# Patient Record
Sex: Male | Born: 1965 | Race: White | Hispanic: No | Marital: Married | Smoking: Current every day smoker
Health system: Southern US, Community
[De-identification: ages and names within clinical notes are randomized; demographics above are authoritative.]

## PROBLEM LIST (undated history)

## (undated) DIAGNOSIS — G8929 Other chronic pain: Secondary | ICD-10-CM

## (undated) DIAGNOSIS — M5430 Sciatica, unspecified side: Secondary | ICD-10-CM

## (undated) DIAGNOSIS — J449 Chronic obstructive pulmonary disease, unspecified: Secondary | ICD-10-CM

## (undated) DIAGNOSIS — J189 Pneumonia, unspecified organism: Secondary | ICD-10-CM

## (undated) DIAGNOSIS — M5030 Other cervical disc degeneration, unspecified cervical region: Secondary | ICD-10-CM

## (undated) DIAGNOSIS — M549 Dorsalgia, unspecified: Secondary | ICD-10-CM

## (undated) HISTORY — PX: COLONOSCOPY: SHX174

---

## 2001-06-26 ENCOUNTER — Emergency Department (HOSPITAL_COMMUNITY): Admission: EM | Admit: 2001-06-26 | Discharge: 2001-06-26 | Payer: Self-pay | Admitting: Emergency Medicine

## 2001-06-26 ENCOUNTER — Encounter: Payer: Self-pay | Admitting: Emergency Medicine

## 2002-09-29 ENCOUNTER — Emergency Department (HOSPITAL_COMMUNITY): Admission: EM | Admit: 2002-09-29 | Discharge: 2002-09-29 | Payer: Self-pay | Admitting: *Deleted

## 2002-09-29 ENCOUNTER — Encounter: Payer: Self-pay | Admitting: *Deleted

## 2002-11-20 ENCOUNTER — Emergency Department (HOSPITAL_COMMUNITY): Admission: EM | Admit: 2002-11-20 | Discharge: 2002-11-20 | Payer: Self-pay | Admitting: *Deleted

## 2002-11-20 ENCOUNTER — Encounter: Payer: Self-pay | Admitting: *Deleted

## 2002-12-14 ENCOUNTER — Emergency Department (HOSPITAL_COMMUNITY): Admission: EM | Admit: 2002-12-14 | Discharge: 2002-12-14 | Payer: Self-pay | Admitting: Emergency Medicine

## 2006-09-02 ENCOUNTER — Emergency Department (HOSPITAL_COMMUNITY): Admission: EM | Admit: 2006-09-02 | Discharge: 2006-09-02 | Payer: Self-pay | Admitting: Emergency Medicine

## 2008-03-19 ENCOUNTER — Emergency Department (HOSPITAL_COMMUNITY): Admission: EM | Admit: 2008-03-19 | Discharge: 2008-03-19 | Payer: Self-pay | Admitting: Emergency Medicine

## 2008-03-25 ENCOUNTER — Emergency Department (HOSPITAL_COMMUNITY): Admission: EM | Admit: 2008-03-25 | Discharge: 2008-03-25 | Payer: Self-pay | Admitting: Emergency Medicine

## 2008-04-24 ENCOUNTER — Emergency Department (HOSPITAL_COMMUNITY): Admission: EM | Admit: 2008-04-24 | Discharge: 2008-04-24 | Payer: Self-pay | Admitting: Emergency Medicine

## 2008-05-14 ENCOUNTER — Emergency Department (HOSPITAL_COMMUNITY): Admission: EM | Admit: 2008-05-14 | Discharge: 2008-05-14 | Payer: Self-pay | Admitting: Emergency Medicine

## 2008-09-23 ENCOUNTER — Emergency Department (HOSPITAL_COMMUNITY): Admission: EM | Admit: 2008-09-23 | Discharge: 2008-09-23 | Payer: Self-pay | Admitting: Emergency Medicine

## 2008-10-14 ENCOUNTER — Emergency Department (HOSPITAL_COMMUNITY): Admission: EM | Admit: 2008-10-14 | Discharge: 2008-10-14 | Payer: Self-pay | Admitting: Emergency Medicine

## 2009-04-18 ENCOUNTER — Emergency Department (HOSPITAL_COMMUNITY): Admission: EM | Admit: 2009-04-18 | Discharge: 2009-04-18 | Payer: Self-pay | Admitting: Emergency Medicine

## 2009-04-19 ENCOUNTER — Encounter (INDEPENDENT_AMBULATORY_CARE_PROVIDER_SITE_OTHER): Payer: Self-pay | Admitting: *Deleted

## 2009-05-04 ENCOUNTER — Telehealth (INDEPENDENT_AMBULATORY_CARE_PROVIDER_SITE_OTHER): Payer: Self-pay | Admitting: *Deleted

## 2009-07-07 ENCOUNTER — Emergency Department (HOSPITAL_COMMUNITY): Admission: EM | Admit: 2009-07-07 | Discharge: 2009-07-07 | Payer: Self-pay | Admitting: Emergency Medicine

## 2009-07-28 ENCOUNTER — Emergency Department (HOSPITAL_COMMUNITY): Admission: EM | Admit: 2009-07-28 | Discharge: 2009-07-28 | Payer: Self-pay | Admitting: Emergency Medicine

## 2010-03-13 ENCOUNTER — Emergency Department (HOSPITAL_COMMUNITY): Admission: EM | Admit: 2010-03-13 | Discharge: 2010-03-13 | Payer: Self-pay | Admitting: Emergency Medicine

## 2010-03-20 ENCOUNTER — Ambulatory Visit (HOSPITAL_COMMUNITY): Admission: RE | Admit: 2010-03-20 | Discharge: 2010-03-20 | Payer: Self-pay | Admitting: Orthopaedic Surgery

## 2010-04-05 ENCOUNTER — Encounter (HOSPITAL_COMMUNITY): Admission: RE | Admit: 2010-04-05 | Discharge: 2010-05-05 | Payer: Self-pay | Admitting: Orthopaedic Surgery

## 2010-05-08 ENCOUNTER — Emergency Department (HOSPITAL_COMMUNITY): Admission: EM | Admit: 2010-05-08 | Discharge: 2010-05-08 | Payer: Self-pay | Admitting: Emergency Medicine

## 2010-05-29 ENCOUNTER — Emergency Department (HOSPITAL_COMMUNITY): Admission: EM | Admit: 2010-05-29 | Discharge: 2010-05-30 | Payer: Self-pay | Admitting: Emergency Medicine

## 2010-06-09 ENCOUNTER — Emergency Department (HOSPITAL_COMMUNITY): Admission: EM | Admit: 2010-06-09 | Discharge: 2010-06-09 | Payer: Self-pay | Admitting: Emergency Medicine

## 2010-06-20 ENCOUNTER — Inpatient Hospital Stay (HOSPITAL_COMMUNITY): Admission: EM | Admit: 2010-06-20 | Discharge: 2010-06-23 | Payer: Self-pay | Admitting: Emergency Medicine

## 2010-06-29 ENCOUNTER — Emergency Department (HOSPITAL_COMMUNITY): Admission: EM | Admit: 2010-06-29 | Discharge: 2010-06-29 | Payer: Self-pay | Admitting: Emergency Medicine

## 2010-07-11 ENCOUNTER — Emergency Department (HOSPITAL_COMMUNITY)
Admission: EM | Admit: 2010-07-11 | Discharge: 2010-07-11 | Payer: Self-pay | Source: Home / Self Care | Admitting: Emergency Medicine

## 2010-07-25 ENCOUNTER — Emergency Department (HOSPITAL_COMMUNITY)
Admission: EM | Admit: 2010-07-25 | Discharge: 2010-07-25 | Payer: Self-pay | Source: Home / Self Care | Admitting: Emergency Medicine

## 2010-08-10 ENCOUNTER — Emergency Department (HOSPITAL_COMMUNITY): Admission: EM | Admit: 2010-08-10 | Discharge: 2010-08-10 | Payer: Self-pay | Admitting: Emergency Medicine

## 2010-09-07 ENCOUNTER — Emergency Department (HOSPITAL_COMMUNITY)
Admission: EM | Admit: 2010-09-07 | Discharge: 2010-09-07 | Payer: Self-pay | Source: Home / Self Care | Admitting: Emergency Medicine

## 2010-09-15 ENCOUNTER — Emergency Department (HOSPITAL_COMMUNITY): Admission: EM | Admit: 2010-09-15 | Discharge: 2010-09-15 | Payer: Self-pay | Admitting: Emergency Medicine

## 2010-09-27 ENCOUNTER — Emergency Department (HOSPITAL_COMMUNITY)
Admission: EM | Admit: 2010-09-27 | Discharge: 2010-09-27 | Payer: Self-pay | Source: Home / Self Care | Admitting: Emergency Medicine

## 2010-10-08 ENCOUNTER — Emergency Department (HOSPITAL_COMMUNITY)
Admission: EM | Admit: 2010-10-08 | Discharge: 2010-10-08 | Payer: Self-pay | Source: Home / Self Care | Admitting: Emergency Medicine

## 2010-10-23 ENCOUNTER — Emergency Department (HOSPITAL_COMMUNITY)
Admission: EM | Admit: 2010-10-23 | Discharge: 2010-10-23 | Payer: Self-pay | Source: Home / Self Care | Admitting: Emergency Medicine

## 2010-10-24 ENCOUNTER — Emergency Department (HOSPITAL_COMMUNITY)
Admission: EM | Admit: 2010-10-24 | Discharge: 2010-10-24 | Payer: Self-pay | Source: Home / Self Care | Admitting: Emergency Medicine

## 2010-11-10 ENCOUNTER — Emergency Department (HOSPITAL_COMMUNITY)
Admission: EM | Admit: 2010-11-10 | Discharge: 2010-11-10 | Payer: Self-pay | Source: Home / Self Care | Admitting: Emergency Medicine

## 2010-11-30 ENCOUNTER — Emergency Department (HOSPITAL_COMMUNITY)
Admission: EM | Admit: 2010-11-30 | Discharge: 2010-11-30 | Payer: Self-pay | Source: Home / Self Care | Admitting: Emergency Medicine

## 2011-01-01 ENCOUNTER — Emergency Department (HOSPITAL_COMMUNITY): Payer: PRIVATE HEALTH INSURANCE

## 2011-01-01 ENCOUNTER — Emergency Department (HOSPITAL_COMMUNITY)
Admission: EM | Admit: 2011-01-01 | Discharge: 2011-01-01 | Disposition: A | Discharge status: Home / Self Care | Payer: PRIVATE HEALTH INSURANCE | Attending: Emergency Medicine | Admitting: Emergency Medicine

## 2011-01-01 DIAGNOSIS — S1093XA Contusion of unspecified part of neck, initial encounter: Secondary | ICD-10-CM | POA: Insufficient documentation

## 2011-01-01 DIAGNOSIS — M542 Cervicalgia: Secondary | ICD-10-CM | POA: Insufficient documentation

## 2011-01-01 DIAGNOSIS — IMO0002 Reserved for concepts with insufficient information to code with codable children: Secondary | ICD-10-CM | POA: Insufficient documentation

## 2011-01-01 DIAGNOSIS — S0990XA Unspecified injury of head, initial encounter: Secondary | ICD-10-CM | POA: Insufficient documentation

## 2011-01-01 DIAGNOSIS — S0003XA Contusion of scalp, initial encounter: Secondary | ICD-10-CM | POA: Insufficient documentation

## 2011-01-01 DIAGNOSIS — W11XXXA Fall on and from ladder, initial encounter: Secondary | ICD-10-CM | POA: Insufficient documentation

## 2011-01-01 DIAGNOSIS — S20229A Contusion of unspecified back wall of thorax, initial encounter: Secondary | ICD-10-CM | POA: Insufficient documentation

## 2011-01-01 LAB — CBC
HCT: 45.4 % (ref 39.0–52.0)
Hemoglobin: 15.2 g/dL (ref 13.0–17.0)
MCHC: 33.5 g/dL (ref 30.0–36.0)
MCV: 86.3 fL (ref 78.0–100.0)
Platelets: 290 10*3/uL (ref 150–400)
RBC: 5.26 MIL/uL (ref 4.22–5.81)

## 2011-01-01 LAB — DIFFERENTIAL
Basophils Absolute: 0 10*3/uL (ref 0.0–0.1)
Eosinophils Absolute: 0.1 10*3/uL (ref 0.0–0.7)
Eosinophils Relative: 1 % (ref 0–5)
Lymphs Abs: 2.6 10*3/uL (ref 0.7–4.0)
Monocytes Relative: 10 % (ref 3–12)
Neutrophils Relative %: 51 % (ref 43–77)

## 2011-01-01 LAB — URINALYSIS, ROUTINE W REFLEX MICROSCOPIC
Bilirubin Urine: NEGATIVE
Hgb urine dipstick: NEGATIVE
Specific Gravity, Urine: <1.005 — ABNORMAL LOW (ref 1.005–1.030)
Urobilinogen, UA: 0.2 mg/dL (ref 0.0–1.0)

## 2011-01-16 ENCOUNTER — Emergency Department (HOSPITAL_COMMUNITY)
Admission: EM | Admit: 2011-01-16 | Discharge: 2011-01-16 | Disposition: A | Discharge status: Home / Self Care | Payer: Self-pay | Attending: Emergency Medicine | Admitting: Emergency Medicine

## 2011-01-16 DIAGNOSIS — M549 Dorsalgia, unspecified: Secondary | ICD-10-CM | POA: Insufficient documentation

## 2011-01-16 DIAGNOSIS — G8929 Other chronic pain: Secondary | ICD-10-CM | POA: Insufficient documentation

## 2011-01-25 LAB — CBC
MCHC: 33.5 g/dL (ref 30.0–36.0)
MCV: 87.2 fL (ref 78.0–100.0)
Platelets: 684 10*3/uL — ABNORMAL HIGH (ref 150–400)
RBC: 4.72 MIL/uL (ref 4.22–5.81)
RDW: 16.5 % — ABNORMAL HIGH (ref 11.5–15.5)

## 2011-01-25 LAB — DIFFERENTIAL
Basophils Relative: 0 % (ref 0–1)
Eosinophils Absolute: 0.1 10*3/uL (ref 0.0–0.7)
Lymphocytes Relative: 46 % (ref 12–46)
Monocytes Absolute: 1.2 10*3/uL — ABNORMAL HIGH (ref 0.1–1.0)
Neutro Abs: 5 10*3/uL (ref 1.7–7.7)
Neutrophils Relative %: 43 % (ref 43–77)

## 2011-01-25 LAB — BASIC METABOLIC PANEL
BUN: 15 mg/dL (ref 6–23)
CO2: 26 mEq/L (ref 19–32)
Calcium: 8.8 mg/dL (ref 8.4–10.5)
Creatinine, Ser: 1.33 mg/dL (ref 0.4–1.5)

## 2011-01-25 LAB — BLOOD GAS, ARTERIAL
Bicarbonate: 26.8 mEq/L — ABNORMAL HIGH (ref 20.0–24.0)
FIO2: 0.21 %
TCO2: 23.4 mmol/L (ref 0–100)

## 2011-01-26 LAB — URINE CULTURE: Culture: NO GROWTH

## 2011-01-26 LAB — DIFFERENTIAL
Basophils Absolute: 0 10*3/uL (ref 0.0–0.1)
Basophils Absolute: 0 10*3/uL (ref 0.0–0.1)
Basophils Relative: 0 % (ref 0–1)
Basophils Relative: 1 % (ref 0–1)
Eosinophils Relative: 1 % (ref 0–5)
Lymphocytes Relative: 24 % (ref 12–46)
Lymphs Abs: 0.9 10*3/uL (ref 0.7–4.0)
Lymphs Abs: 2 10*3/uL (ref 0.7–4.0)
Monocytes Absolute: 0.4 10*3/uL (ref 0.1–1.0)
Monocytes Relative: 6 % (ref 3–12)
Monocytes Relative: 9 % (ref 3–12)
Neutro Abs: 5.4 10*3/uL (ref 1.7–7.7)
Neutro Abs: 6.8 10*3/uL (ref 1.7–7.7)
Neutrophils Relative %: 66 % (ref 43–77)
Neutrophils Relative %: 84 % — ABNORMAL HIGH (ref 43–77)
Neutrophils Relative %: 84 % — ABNORMAL HIGH (ref 43–77)

## 2011-01-26 LAB — BASIC METABOLIC PANEL
BUN: 21 mg/dL (ref 6–23)
BUN: 32 mg/dL — ABNORMAL HIGH (ref 6–23)
CO2: 26 mEq/L (ref 19–32)
Calcium: 9 mg/dL (ref 8.4–10.5)
Calcium: 9.3 mg/dL (ref 8.4–10.5)
Calcium: 9.3 mg/dL (ref 8.4–10.5)
Chloride: 110 mEq/L (ref 96–112)
Creatinine, Ser: 1.06 mg/dL (ref 0.4–1.5)
Creatinine, Ser: 1.82 mg/dL — ABNORMAL HIGH (ref 0.4–1.5)
GFR calc Af Amer: >60 mL/min (ref >60)
GFR calc Af Amer: >60 mL/min (ref >60)
GFR calc non Af Amer: 58 mL/min — ABNORMAL LOW (ref >60)
GFR calc non Af Amer: >60 mL/min (ref >60)
GFR calc non Af Amer: >60 mL/min (ref >60)
Glucose, Bld: 133 mg/dL — ABNORMAL HIGH (ref 70–99)
Potassium: 3.6 mEq/L (ref 3.5–5.1)
Sodium: 135 mEq/L (ref 135–145)
Sodium: 140 mEq/L (ref 135–145)

## 2011-01-26 LAB — CBC
HCT: 37.5 % — ABNORMAL LOW (ref 39.0–52.0)
Hemoglobin: 12.1 g/dL — ABNORMAL LOW (ref 13.0–17.0)
MCH: 29.5 pg (ref 26.0–34.0)
MCHC: 33.6 g/dL (ref 30.0–36.0)
MCV: 86.1 fL (ref 78.0–100.0)
Platelets: 283 10*3/uL (ref 150–400)
RBC: 4.11 MIL/uL — ABNORMAL LOW (ref 4.22–5.81)
RDW: 16.1 % — ABNORMAL HIGH (ref 11.5–15.5)
RDW: 16.7 % — ABNORMAL HIGH (ref 11.5–15.5)
WBC: 10.5 10*3/uL (ref 4.0–10.5)
WBC: 8.1 10*3/uL (ref 4.0–10.5)

## 2011-01-26 LAB — CULTURE, BLOOD (ROUTINE X 2)
Culture: NO GROWTH
Report Status: 8142011
Report Status: 8142011

## 2011-01-26 LAB — BLOOD GAS, ARTERIAL
Bicarbonate: 21.8 mEq/L (ref 20.0–24.0)
O2 Saturation: 98.4 %
pH, Arterial: 7.4 (ref 7.350–7.450)
pO2, Arterial: 118 mmHg — ABNORMAL HIGH (ref 80.0–100.0)

## 2011-01-26 LAB — PROCALCITONIN: Procalcitonin: 42.28 ng/mL

## 2011-01-26 LAB — EXPECTORATED SPUTUM ASSESSMENT W GRAM STAIN, RFLX TO RESP C

## 2011-01-26 LAB — CULTURE, RESPIRATORY W GRAM STAIN: Culture: NORMAL

## 2011-01-26 LAB — ROCKY MTN SPOTTED FVR AB, IGG-BLOOD: RMSF IgG: 0.05 IV

## 2011-01-26 LAB — URINE MICROSCOPIC-ADD ON

## 2011-01-26 LAB — URINALYSIS, ROUTINE W REFLEX MICROSCOPIC
Glucose, UA: NEGATIVE mg/dL
Nitrite: NEGATIVE
Specific Gravity, Urine: 1.025 (ref 1.005–1.030)
pH: 5.5 (ref 5.0–8.0)

## 2011-01-26 LAB — RAPID URINE DRUG SCREEN, HOSP PERFORMED
Cocaine: POSITIVE — AB
Opiates: NOT DETECTED
Tetrahydrocannabinol: NOT DETECTED

## 2011-01-26 LAB — LACTIC ACID, PLASMA: Lactic Acid, Venous: 3 mmol/L — ABNORMAL HIGH (ref 0.5–2.2)

## 2011-01-26 LAB — B. BURGDORFI ANTIBODIES: B burgdorferi Ab IgG+IgM: 0.23 {ISR}

## 2011-02-19 LAB — CBC
HCT: 42.3 % (ref 39.0–52.0)
Hemoglobin: 14.9 g/dL (ref 13.0–17.0)
MCHC: 35.3 g/dL (ref 30.0–36.0)
RBC: 4.92 MIL/uL (ref 4.22–5.81)
RDW: 15 % (ref 11.5–15.5)

## 2011-02-19 LAB — DIFFERENTIAL
Basophils Absolute: 0 10*3/uL (ref 0.0–0.1)
Basophils Relative: 0 % (ref 0–1)
Monocytes Absolute: 0.4 10*3/uL (ref 0.1–1.0)
Neutro Abs: 5.8 10*3/uL (ref 1.7–7.7)
Neutrophils Relative %: 72 % (ref 43–77)

## 2011-02-19 LAB — BASIC METABOLIC PANEL
CO2: 26 mEq/L (ref 19–32)
Chloride: 106 mEq/L (ref 96–112)
GFR calc Af Amer: >60 mL/min (ref >60)
Glucose, Bld: 105 mg/dL — ABNORMAL HIGH (ref 70–99)
Potassium: 4 mEq/L (ref 3.5–5.1)
Sodium: 139 mEq/L (ref 135–145)

## 2011-03-06 ENCOUNTER — Emergency Department (HOSPITAL_COMMUNITY)
Admission: EM | Admit: 2011-03-06 | Discharge: 2011-03-06 | Disposition: A | Discharge status: Home / Self Care | Payer: Self-pay | Attending: Emergency Medicine | Admitting: Emergency Medicine

## 2011-03-06 DIAGNOSIS — G8929 Other chronic pain: Secondary | ICD-10-CM | POA: Insufficient documentation

## 2011-03-06 DIAGNOSIS — M545 Low back pain, unspecified: Secondary | ICD-10-CM | POA: Insufficient documentation

## 2011-03-06 DIAGNOSIS — Z79899 Other long term (current) drug therapy: Secondary | ICD-10-CM | POA: Insufficient documentation

## 2011-08-14 LAB — URINALYSIS, ROUTINE W REFLEX MICROSCOPIC
Bilirubin Urine: NEGATIVE
Nitrite: NEGATIVE
Specific Gravity, Urine: 1.025
Urobilinogen, UA: 0.2

## 2011-10-19 ENCOUNTER — Emergency Department (HOSPITAL_COMMUNITY): Payer: Self-pay

## 2011-10-19 ENCOUNTER — Emergency Department (HOSPITAL_COMMUNITY)
Admission: EM | Admit: 2011-10-19 | Discharge: 2011-10-19 | Disposition: A | Discharge status: Home / Self Care | Payer: Self-pay | Attending: Emergency Medicine | Admitting: Emergency Medicine

## 2011-10-19 ENCOUNTER — Encounter: Payer: Self-pay | Admitting: *Deleted

## 2011-10-19 DIAGNOSIS — Z8701 Personal history of pneumonia (recurrent): Secondary | ICD-10-CM | POA: Insufficient documentation

## 2011-10-19 DIAGNOSIS — J209 Acute bronchitis, unspecified: Secondary | ICD-10-CM | POA: Insufficient documentation

## 2011-10-19 DIAGNOSIS — F17200 Nicotine dependence, unspecified, uncomplicated: Secondary | ICD-10-CM | POA: Insufficient documentation

## 2011-10-19 DIAGNOSIS — J438 Other emphysema: Secondary | ICD-10-CM | POA: Insufficient documentation

## 2011-10-19 DIAGNOSIS — IMO0001 Reserved for inherently not codable concepts without codable children: Secondary | ICD-10-CM

## 2011-10-19 HISTORY — DX: Pneumonia, unspecified organism: J18.9

## 2011-10-19 HISTORY — DX: Other chronic pain: G89.29

## 2011-10-19 HISTORY — DX: Dorsalgia, unspecified: M54.9

## 2011-10-19 MED ORDER — OXYCODONE-ACETAMINOPHEN 5-325 MG PO TABS
1.0000 | ORAL_TABLET | Freq: Once | ORAL | Status: AC
  Administered 2011-10-19: 1 via ORAL
  Filled 2011-10-19: qty 1

## 2011-10-19 MED ORDER — OXYCODONE-ACETAMINOPHEN 5-325 MG PO TABS: 1.0000 | ORAL_TABLET | ORAL | Status: AC | PRN

## 2011-10-19 MED ORDER — PREDNISONE 20 MG PO TABS: ORAL_TABLET | ORAL | Status: DC

## 2011-10-19 MED ORDER — BENZONATATE 100 MG PO CAPS
200.0000 mg | ORAL_CAPSULE | Freq: Once | ORAL | Status: AC
  Administered 2011-10-19: 200 mg via ORAL
  Filled 2011-10-19: qty 2

## 2011-10-19 MED ORDER — BENZONATATE 200 MG PO CAPS: 200.0000 mg | ORAL_CAPSULE | Freq: Two times a day (BID) | ORAL | Status: AC | PRN

## 2011-10-19 MED ORDER — PREDNISONE 20 MG PO TABS
60.0000 mg | ORAL_TABLET | Freq: Once | ORAL | Status: AC
  Administered 2011-10-19: 60 mg via ORAL
  Filled 2011-10-19: qty 3

## 2011-10-19 NOTE — ED Notes (Signed)
Pt c/o right pain to rib area and sob; pt states he is very tired and just lying around; pt states he was in ICU x 1 year ago for pneumonia

## 2011-10-19 NOTE — ED Notes (Signed)
Pt called to be roomed. No answer.

## 2011-10-19 NOTE — ED Notes (Addendum)
Pt presents with SOB and right rib pain. Pt reports weakness and general not feeling well. Pt states approx 1 yr ago he was hospitalized for pneumonia. NAD at this time.

## 2011-10-19 NOTE — ED Notes (Signed)
Pt a/ox4. Resp even and unlabored. NAD at this time. D/C instructions reviewed with pt. Pt verbalized understanding. Pt ambulated to lobby with steady gate.  

## 2011-10-20 NOTE — ED Provider Notes (Signed)
History     CSN: [id number] Arrival date & time: 10/19/2011  2:55 PM   First MD Initiated Contact with Patient 10/19/11 1518      Chief Complaint  Patient presents with  . Shortness of Breath    (Consider location/radiation/quality/duration/timing/severity/associated sxs/prior treatment) Patient is a 45 y.o. male presenting with shortness of breath. The history is provided by the patient.  Shortness of Breath  The current episode started 3 to 5 days ago. The onset was gradual. The problem has been gradually worsening. The problem is moderate. The symptoms are relieved by nothing. The symptoms are aggravated by smoke exposure. Associated symptoms include chest pain, cough and shortness of breath. Pertinent negatives include no fever and no sore throat. The cough is non-productive and hacking. Nothing relieves the cough. The cough is worsened by smoke exposure. Incident Type: He is concerned about possible exposure to lead based paint,  stating last summer he sanded walls of a house without wearing a mask,  and learned later the house had lead based paint.  He denies hemoptysis. He has had no prior steroid use. He has had prior ICU admissions (He was treated last year for pneumonia per patient report.). There were no sick contacts. He has received no recent medical care.    Past Medical History  Diagnosis Date  . Pneumonia   . Chronic back pain     History reviewed. No pertinent past surgical history.  History reviewed. No pertinent family history.  History  Substance Use Topics  . Smoking status: Current Everyday Smoker  . Smokeless tobacco: Not on file  . Alcohol Use: No      Review of Systems  Constitutional: Negative for fever.  HENT: Negative for congestion, sore throat and neck pain.   Eyes: Negative.   Respiratory: Positive for cough and shortness of breath. Negative for chest tightness.   Cardiovascular: Positive for chest pain. Negative for palpitations and leg  swelling.  Gastrointestinal: Negative for nausea and abdominal pain.  Genitourinary: Negative.   Musculoskeletal: Negative for joint swelling and arthralgias.  Skin: Negative.  Negative for rash and wound.  Neurological: Negative for dizziness, weakness, light-headedness, numbness and headaches.  Hematological: Negative.   Psychiatric/Behavioral: Negative.     Allergies  Review of patient's allergies indicates no known allergies.  Home Medications   Current Outpatient Rx  Name Route Sig Dispense Refill  . ALBUTEROL SULFATE (2.5 MG/3ML) 0.083% IN NEBU Nebulization Take 2.5 mg by nebulization every 6 (six) hours as needed. For shortness of breath     . IBUPROFEN 200 MG PO TABS Oral Take 1,200 mg by mouth 6 (six) times daily. For pain     . BENZONATATE 200 MG PO CAPS Oral Take 1 capsule (200 mg total) by mouth 2 (two) times daily as needed for cough. 20 capsule 0  . OXYCODONE-ACETAMINOPHEN 5-325 MG PO TABS Oral Take 1 tablet by mouth every 4 (four) hours as needed for pain. 20 tablet 0  . PREDNISONE 20 MG PO TABS  Take 6 tabs daily by mouth for 1 day,  Then 5 tabs daily for 1 day,  4 tabs daily for 1 day,  3 tabs daily for1 day,  2 tabs daily for 1 day,  Then 1 tab daily for 1 day.   21 tablet 0    BP 113/84  Pulse 67  Temp(Src) 97.4 F (36.3 C) (Oral)  Resp 24  Ht 6' (1.829 m)  Wt 135 lb (61.236 kg)  BMI 18.31 kg/m2  SpO2 99%  Physical Exam  Nursing note and vitals reviewed. Constitutional: He is oriented to person, place, and time. He appears well-developed and well-nourished.  HENT:  Head: Normocephalic and atraumatic.  Eyes: Conjunctivae are normal.  Neck: Normal range of motion.  Cardiovascular: Normal rate, regular rhythm, normal heart sounds and intact distal pulses.   Pulmonary/Chest: Effort normal. He has no decreased breath sounds. He has no wheezes. He has no rhonchi. He has no rales. He exhibits tenderness.         Coarse breath sounds throughout all lung  fields.  Abdominal: Soft. Bowel sounds are normal. There is no tenderness.  Musculoskeletal: Normal range of motion.  Neurological: He is alert and oriented to person, place, and time.  Skin: Skin is warm and dry.  Psychiatric: He has a normal mood and affect.    ED Course  Procedures (including critical care time)  Labs Reviewed - No data to display Dg Chest 2 View  10/19/2011  *RADIOLOGY REPORT*  Clinical Data: Shortness of breath, cough and congestion.  CHEST - 2 VIEW  Comparison: Plain films of the chest 01/01/2011.  CT chest 06/29/2010.  Findings: Lungs are emphysematous but clear.  No pneumothorax or pleural effusion.  Heart size is normal.  IMPRESSION: Emphysema without acute disease.  Original Report Authenticated By: Bernadene Bell. D'ALESSIO, M.D.     1. Bronchitis with emphysema       MDM  Tessalon script for persistent cough,  Oxycodone for chest wall pain and cough suppression.  Prednisone also for cough and possible inflammatory process.   Referral to Dr. Juanetta Gosling for further eval of pulmonary complaints.  Results for orders placed during the hospital encounter of 01/01/11  DIFFERENTIAL      Component Value Range   Neutrophils Relative 51  43 - 77 (%)   Neutro Abs 3.6  1.7 - 7.7 (K/uL)   Lymphocytes Relative 37  12 - 46 (%)   Lymphs Abs 2.6  0.7 - 4.0 (K/uL)   Monocytes Relative 10  3 - 12 (%)   Monocytes Absolute 0.7  0.1 - 1.0 (K/uL)   Eosinophils Relative 1  0 - 5 (%)   Eosinophils Absolute 0.1  0.0 - 0.7 (K/uL)   Basophils Relative 0  0 - 1 (%)   Basophils Absolute 0.0  0.0 - 0.1 (K/uL)  CBC      Component Value Range   WBC 7.0  4.0 - 10.5 (K/uL)   RBC 5.26  4.22 - 5.81 (MIL/uL)   Hemoglobin 15.2  13.0 - 17.0 (g/dL)   HCT 16.1  09.6 - 04.5 (%)   MCV 86.3  78.0 - 100.0 (fL)   MCH 28.9  26.0 - 34.0 (pg)   MCHC 33.5  30.0 - 36.0 (g/dL)   RDW 40.9  81.1 - 91.4 (%)   Platelets 290  150 - 400 (K/uL)  URINALYSIS, ROUTINE W REFLEX MICROSCOPIC      Component Value  Range   Color, Urine YELLOW  YELLOW    APPearance CLEAR  CLEAR    Specific Gravity, Urine <1.005 (*) 1.005 - 1.030    pH 5.0  5.0 - 8.0    Urine Glucose, Fasting NEGATIVE  NEGATIVE (mg/dL)   Hgb urine dipstick NEGATIVE  NEGATIVE    Bilirubin Urine NEGATIVE  NEGATIVE    Ketones, ur NEGATIVE  NEGATIVE (mg/dL)   Protein, ur NEGATIVE  NEGATIVE (mg/dL)   Urobilinogen, UA 0.2  0.0 - 1.0 (mg/dL)   Nitrite NEGATIVE  NEGATIVE    Leukocytes, UA    NEGATIVE    Value: NEGATIVE MICROSCOPIC NOT DONE ON URINES WITH NEGATIVE PROTEIN, BLOOD, LEUKOCYTES, NITRITE, OR GLUCOSE <1000 mg/dL.   Dg Chest 2 View  10/19/2011  *RADIOLOGY REPORT*  Clinical Data: Shortness of breath, cough and congestion.  CHEST - 2 VIEW  Comparison: Plain films of the chest 01/01/2011.  CT chest 06/29/2010.  Findings: Lungs are emphysematous but clear.  No pneumothorax or pleural effusion.  Heart size is normal.  IMPRESSION: Emphysema without acute disease.  Original Report Authenticated By: Bernadene Bell. Maricela Curet, M.D.            Candis Musa, PA 10/20/11 2154

## 2011-10-21 NOTE — ED Provider Notes (Signed)
Evaluation and management procedures were performed by the PA/NP under my supervision/collaboration.    Felisa Bonier, MD 10/21/11 [phone]

## 2011-10-24 IMAGING — CR DG CHEST 2V
2 series · 2 of 2 positions shown · non-contrast
Comparison: 06/20/2010

CLINICAL DATA: Pneumonia

CHEST - 2 VIEW

[view not recorded (1 of 2)]
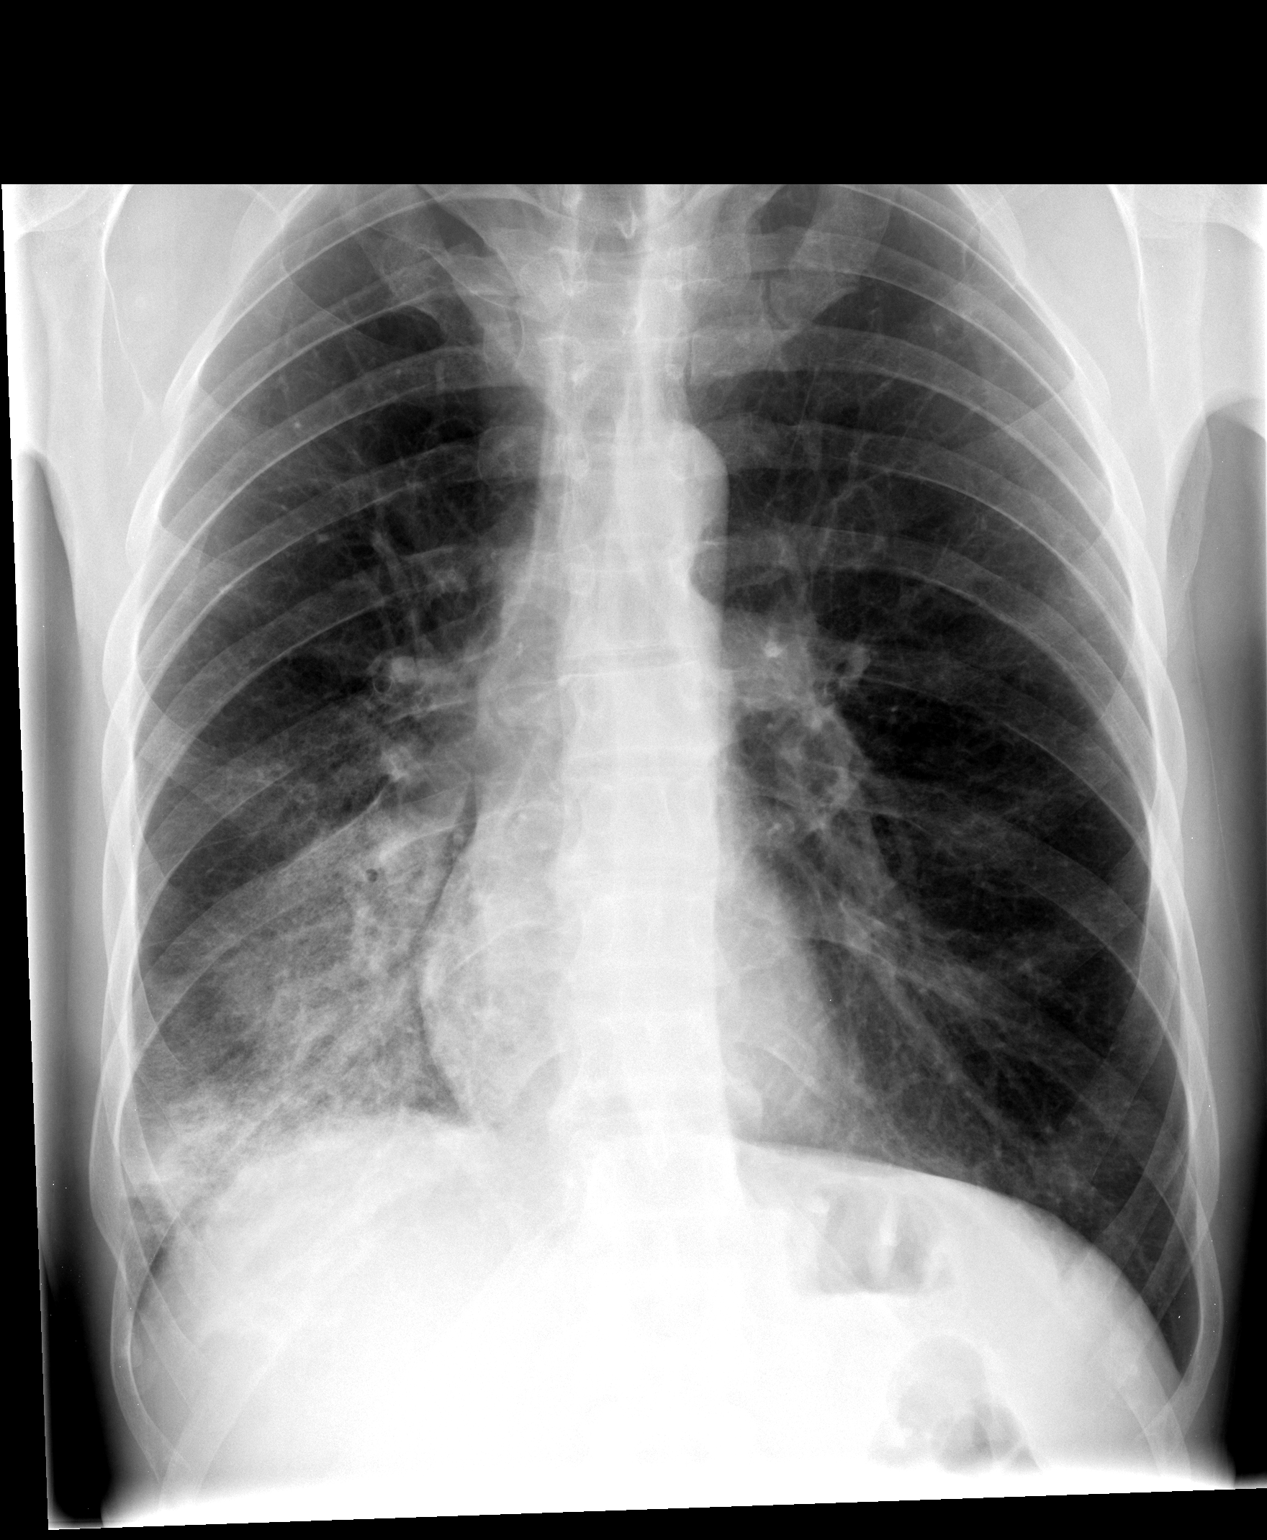

[view not recorded (2 of 2)]
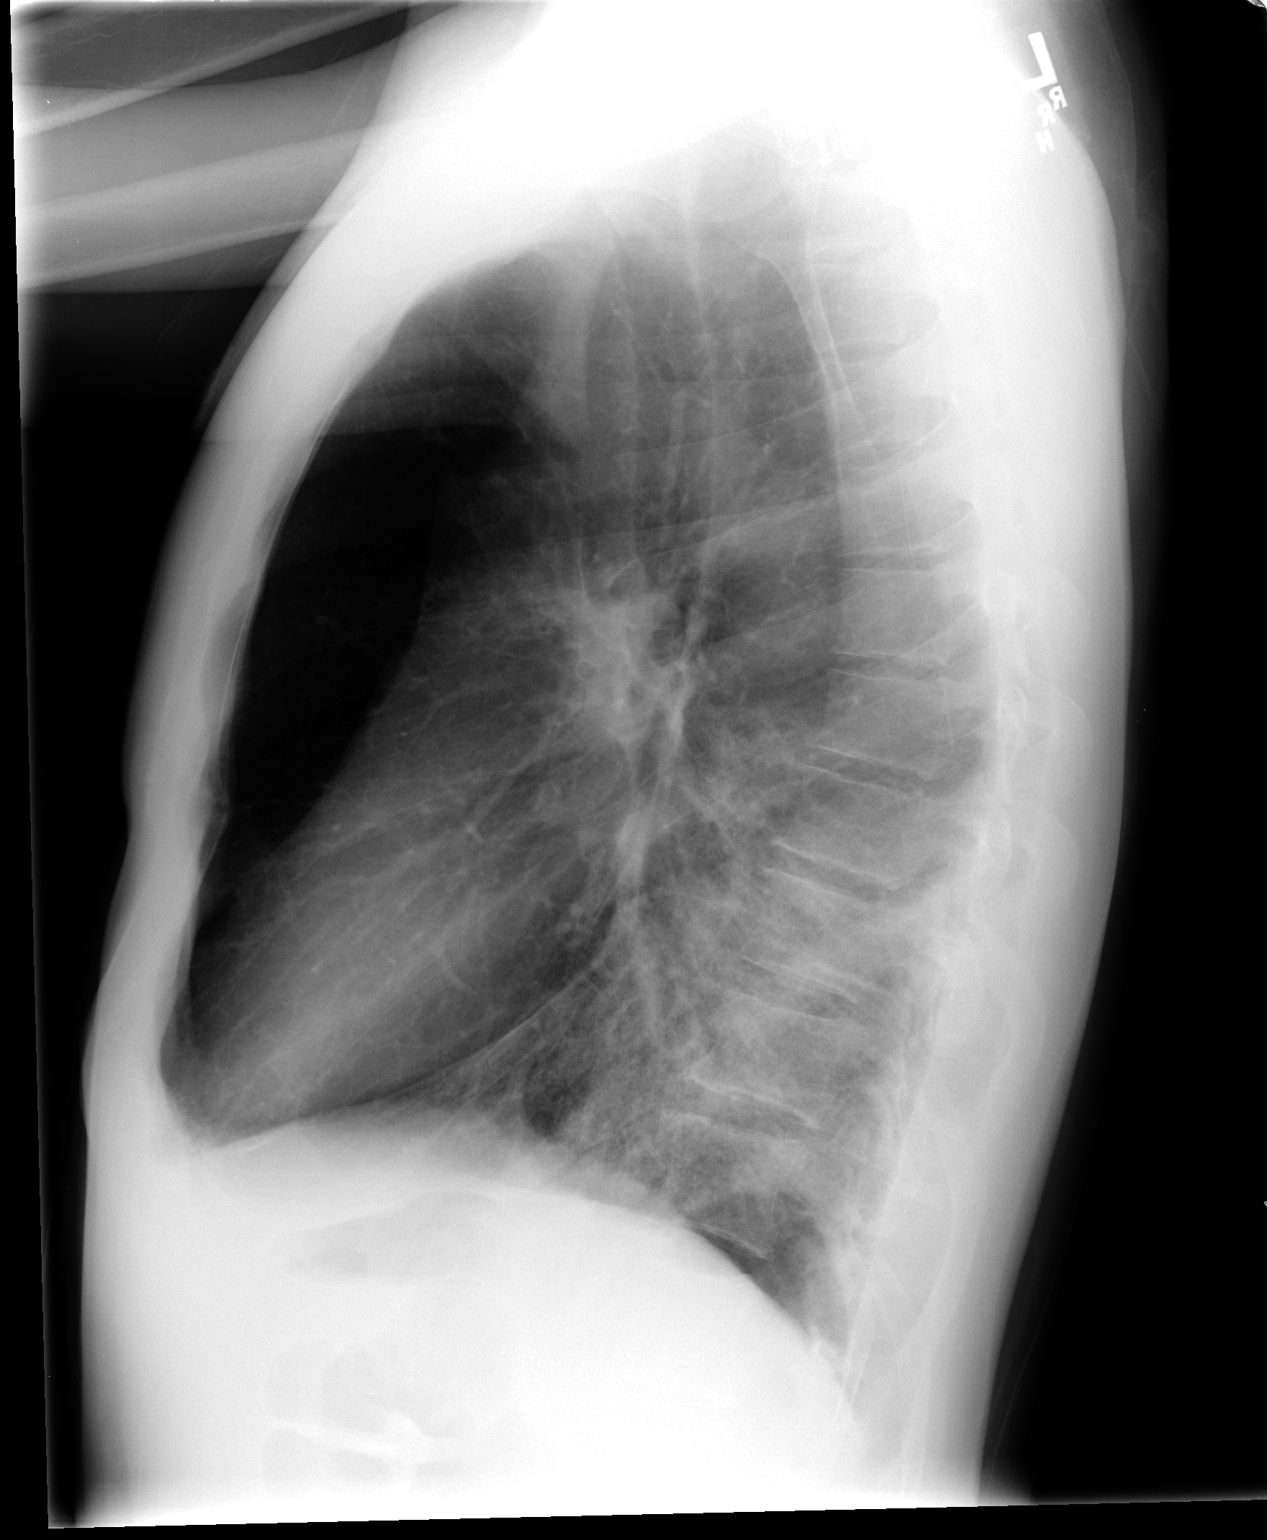

[2 of 2 positions shown; findings below may reference images not displayed]

FINDINGS: There has been slight improvement in the fairly extensive
right lower lobe airspace infiltrate.  Heart size remains normal.
There is no definite effusion.  Regional bones unremarkable peri
IMPRESSION: Marginal improvement in right lower lobe pneumonia.

## 2011-11-02 ENCOUNTER — Emergency Department (HOSPITAL_COMMUNITY): Payer: Self-pay

## 2011-11-02 ENCOUNTER — Encounter (HOSPITAL_COMMUNITY): Payer: Self-pay | Admitting: Emergency Medicine

## 2011-11-02 ENCOUNTER — Emergency Department (HOSPITAL_COMMUNITY)
Admission: EM | Admit: 2011-11-02 | Discharge: 2011-11-02 | Disposition: A | Discharge status: Home / Self Care | Payer: Self-pay | Attending: Emergency Medicine | Admitting: Emergency Medicine

## 2011-11-02 DIAGNOSIS — M25519 Pain in unspecified shoulder: Secondary | ICD-10-CM | POA: Insufficient documentation

## 2011-11-02 DIAGNOSIS — F17200 Nicotine dependence, unspecified, uncomplicated: Secondary | ICD-10-CM | POA: Insufficient documentation

## 2011-11-02 DIAGNOSIS — X500XXA Overexertion from strenuous movement or load, initial encounter: Secondary | ICD-10-CM | POA: Insufficient documentation

## 2011-11-02 DIAGNOSIS — M1990 Unspecified osteoarthritis, unspecified site: Secondary | ICD-10-CM | POA: Insufficient documentation

## 2011-11-02 DIAGNOSIS — S46911A Strain of unspecified muscle, fascia and tendon at shoulder and upper arm level, right arm, initial encounter: Secondary | ICD-10-CM

## 2011-11-02 DIAGNOSIS — IMO0002 Reserved for concepts with insufficient information to code with codable children: Secondary | ICD-10-CM | POA: Insufficient documentation

## 2011-11-02 MED ORDER — HYDROCODONE-ACETAMINOPHEN 5-325 MG PO TABS
2.0000 | ORAL_TABLET | Freq: Once | ORAL | Status: AC
  Administered 2011-11-02: 2 via ORAL
  Filled 2011-11-02: qty 2

## 2011-11-02 MED ORDER — ONDANSETRON HCL 4 MG PO TABS
4.0000 mg | ORAL_TABLET | Freq: Once | ORAL | Status: AC
  Administered 2011-11-02: 4 mg via ORAL
  Filled 2011-11-02: qty 1

## 2011-11-02 MED ORDER — PREDNISONE 10 MG PO TABS: ORAL_TABLET | ORAL | Status: DC

## 2011-11-02 MED ORDER — PREDNISONE 20 MG PO TABS
60.0000 mg | ORAL_TABLET | Freq: Once | ORAL | Status: AC
  Administered 2011-11-02: 60 mg via ORAL
  Filled 2011-11-02: qty 3

## 2011-11-02 MED ORDER — HYDROCODONE-ACETAMINOPHEN 5-325 MG PO TABS: 1.0000 | ORAL_TABLET | ORAL | Status: AC | PRN

## 2011-11-02 NOTE — ED Notes (Signed)
Sling applied to right arm.

## 2011-11-02 NOTE — ED Notes (Signed)
Pt presents with pain to right shoulder. Pt states he had to lift sofa over his head with friend this AM and when he did he felt and heard a pop in right shoulder. Pt also states he is experiencing numbness to right thumb and index finger. Pt has full ROM but pain increases with movement. No swelling, bruising, or deformity noted. X-ray obtained in triage. NAD at this time.

## 2011-11-02 NOTE — ED Notes (Signed)
Pt a/ox4. Resp even and unlabored. NAD at this time. D/C instructions and Rx x2 reviewed with pt. Pt verbalized understanding. Pt ambulated to lobby with steady gate.  

## 2011-11-02 NOTE — ED Provider Notes (Signed)
History     CSN: [id number]  Arrival date & time 11/02/11  1045   First MD Initiated Contact with Patient 11/02/11 1315      Chief Complaint  Patient presents with  . Shoulder Pain    (Consider location/radiation/quality/duration/timing/severity/associated sxs/prior treatment) HPI Comments: Patient reports history of previous problems with the right shoulder. Today he was lifting furniture. He complained of feeling a tearing type sensation in the right shoulder. And has had pain and problem lifting the right arm above his head since that time. He presents for evaluation of the shoulder and for assistance with pain management.  Patient is a 45 y.o. male presenting with shoulder pain. The history is provided by the patient.  Shoulder Pain This is a new problem. The current episode started today. The problem has been gradually worsening. Associated symptoms include arthralgias. Pertinent negatives include no abdominal pain, chest pain, coughing, diaphoresis, joint swelling, neck pain or vomiting. Exacerbated by: movement. He has tried nothing for the symptoms. The treatment provided no relief.    Past Medical History  Diagnosis Date  . Pneumonia   . Chronic back pain     History reviewed. No pertinent past surgical history.  No family history on file.  History  Substance Use Topics  . Smoking status: Current Everyday Smoker    Types: Cigarettes  . Smokeless tobacco: Not on file  . Alcohol Use: No      Review of Systems  Constitutional: Negative for diaphoresis and activity change.       All ROS Neg except as noted in HPI  HENT: Negative for nosebleeds and neck pain.   Eyes: Negative for photophobia and discharge.  Respiratory: Negative for cough, shortness of breath and wheezing.   Cardiovascular: Negative for chest pain and palpitations.  Gastrointestinal: Negative for vomiting, abdominal pain and blood in stool.  Genitourinary: Negative for dysuria, frequency and  hematuria.  Musculoskeletal: Positive for arthralgias. Negative for back pain and joint swelling.  Skin: Negative.   Neurological: Negative for dizziness, seizures and speech difficulty.  Psychiatric/Behavioral: Negative for hallucinations and confusion.    Allergies  Review of patient's allergies indicates no known allergies.  Home Medications   Current Outpatient Rx  Name Route Sig Dispense Refill  . IBUPROFEN 200 MG PO TABS Oral Take 1,200-1,800 mg by mouth 6 (six) times daily. For pain      BP 123/80  Pulse 70  Temp(Src) 97.6 F (36.4 C) (Oral)  Resp 20  Ht 6' (1.829 m)  Wt 135 lb (61.236 kg)  BMI 18.31 kg/m2  SpO2 98%  Physical Exam  Nursing note and vitals reviewed. Constitutional: He is oriented to person, place, and time. He appears well-developed and well-nourished.  Non-toxic appearance.  HENT:  Head: Normocephalic.  Right Ear: Tympanic membrane and external ear normal.  Left Ear: Tympanic membrane and external ear normal.  Eyes: EOM and lids are normal. Pupils are equal, round, and reactive to light.  Neck: Normal range of motion. Neck supple. Carotid bruit is not present.  Cardiovascular: Normal rate, regular rhythm, normal heart sounds, intact distal pulses and normal pulses.   Pulmonary/Chest: Breath sounds normal. No respiratory distress.  Abdominal: Soft. Bowel sounds are normal. There is no tenderness. There is no guarding.  Musculoskeletal: He exhibits tenderness.       No deformity, No effusion. Pain with ROM. Pain with palpation of the [facility] joint area. Decrease ROM of the right shoulder area.  Lymphadenopathy:  Head (right side): No submandibular adenopathy present.       Head (left side): No submandibular adenopathy present.    He has no cervical adenopathy.  Neurological: He is alert and oriented to person, place, and time. He has normal strength. No cranial nerve deficit or sensory deficit.  Skin: Skin is warm and dry.  Psychiatric: He has a  normal mood and affect. His speech is normal.    ED Course  Procedures (including critical care time)  Labs Reviewed - No data to display Dg Shoulder Right  11/02/2011  *RADIOLOGY REPORT*  Clinical Data: Shoulder pain and decreased range of motion.  RIGHT SHOULDER - 2+ VIEW  Comparison: None.  Findings: The humerus is located and the acromioclavicular joint is intact.  Mild to moderate acromioclavicular degenerative change is noted.  There is no fracture.  Imaged right lung and ribs are unremarkable.  IMPRESSION: No acute finding.  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  Original Report Authenticated By: Bernadene Bell. Maricela Curet, M.D.   Pulse Ox 98% on room air.   Dx: Shoulder strain 2. Degenerative joint disease.   MDM  I have reviewed nursing notes, vital signs, and all appropriate lab and imaging results for this patient. DJD of the [facility] joint. No Effusion. Orthopedic followup. Rx for norco and prednisone.        Kathie Dike, [place] 11/02/11 [phone]

## 2011-11-02 NOTE — ED Notes (Signed)
Pt c/o rt shoulder pain after lifting furniture.

## 2011-11-03 NOTE — ED Provider Notes (Signed)
Medical screening examination/treatment/procedure(s) were performed by non-physician practitioner and as supervising physician I was immediately available for consultation/collaboration.    Nelia Shi, MD 11/03/11 2230

## 2012-04-01 ENCOUNTER — Emergency Department (HOSPITAL_COMMUNITY): Payer: Self-pay

## 2012-04-01 ENCOUNTER — Encounter (HOSPITAL_COMMUNITY): Payer: Self-pay

## 2012-04-01 ENCOUNTER — Emergency Department (HOSPITAL_COMMUNITY)
Admission: EM | Admit: 2012-04-01 | Discharge: 2012-04-01 | Disposition: A | Discharge status: Home / Self Care | Payer: Self-pay | Attending: Emergency Medicine | Admitting: Emergency Medicine

## 2012-04-01 DIAGNOSIS — F17200 Nicotine dependence, unspecified, uncomplicated: Secondary | ICD-10-CM | POA: Insufficient documentation

## 2012-04-01 DIAGNOSIS — J3489 Other specified disorders of nose and nasal sinuses: Secondary | ICD-10-CM | POA: Insufficient documentation

## 2012-04-01 DIAGNOSIS — G8929 Other chronic pain: Secondary | ICD-10-CM | POA: Insufficient documentation

## 2012-04-01 DIAGNOSIS — R093 Abnormal sputum: Secondary | ICD-10-CM | POA: Insufficient documentation

## 2012-04-01 DIAGNOSIS — M79609 Pain in unspecified limb: Secondary | ICD-10-CM | POA: Insufficient documentation

## 2012-04-01 DIAGNOSIS — J189 Pneumonia, unspecified organism: Secondary | ICD-10-CM | POA: Insufficient documentation

## 2012-04-01 DIAGNOSIS — M545 Low back pain, unspecified: Secondary | ICD-10-CM | POA: Insufficient documentation

## 2012-04-01 MED ORDER — OXYCODONE-ACETAMINOPHEN 5-325 MG PO TABS
2.0000 | ORAL_TABLET | Freq: Once | ORAL | Status: AC
  Administered 2012-04-01: 2 via ORAL
  Filled 2012-04-01: qty 2

## 2012-04-01 MED ORDER — IBUPROFEN 800 MG PO TABS
800.0000 mg | ORAL_TABLET | Freq: Once | ORAL | Status: AC
  Administered 2012-04-01: 800 mg via ORAL
  Filled 2012-04-01: qty 1

## 2012-04-01 MED ORDER — CEFTRIAXONE SODIUM 1 G IJ SOLR
1.0000 g | Freq: Once | INTRAMUSCULAR | Status: AC
  Administered 2012-04-01: 1 g via INTRAMUSCULAR
  Filled 2012-04-01: qty 10

## 2012-04-01 MED ORDER — IBUPROFEN 800 MG PO TABS: 800.0000 mg | ORAL_TABLET | Freq: Three times a day (TID) | ORAL | Status: AC

## 2012-04-01 MED ORDER — TRAMADOL HCL 50 MG PO TABS: 50.0000 mg | ORAL_TABLET | Freq: Four times a day (QID) | ORAL | Status: AC | PRN

## 2012-04-01 MED ORDER — AZITHROMYCIN 250 MG PO TABS: 250.0000 mg | ORAL_TABLET | Freq: Every day | ORAL | Status: AC

## 2012-04-01 MED ORDER — PREDNISONE 20 MG PO TABS
60.0000 mg | ORAL_TABLET | Freq: Once | ORAL | Status: AC
  Administered 2012-04-01: 60 mg via ORAL
  Filled 2012-04-01: qty 3

## 2012-04-01 MED ORDER — AMOXICILLIN 500 MG PO CAPS: 500.0000 mg | ORAL_CAPSULE | Freq: Three times a day (TID) | ORAL | Status: AC

## 2012-04-01 NOTE — ED Notes (Signed)
23 ml in bladder scanner

## 2012-04-01 NOTE — ED Notes (Signed)
1. Has chronic back pain, pain is worse today, denies any known injury 2. Cough and congestion x 4 days

## 2012-04-01 NOTE — Discharge Instructions (Signed)
Pneumonia, Adult Pneumonia is an infection of the lungs.  CAUSES Pneumonia may be caused by bacteria or a virus. Usually, these infections are caused by breathing infectious particles into the lungs (respiratory tract). SYMPTOMS   Cough.   Fever.   Chest pain.   Increased rate of breathing.   Wheezing.   Mucus production.  DIAGNOSIS  If you have the common symptoms of pneumonia, your caregiver will typically confirm the diagnosis with a chest X-ray. The X-ray will show an abnormality in the lung (pulmonary infiltrate) if you have pneumonia. Other tests of your blood, urine, or sputum may be done to find the specific cause of your pneumonia. Your caregiver may also do tests (blood gases or pulse oximetry) to see how well your lungs are working. TREATMENT  Some forms of pneumonia may be spread to other people when you cough or sneeze. You may be asked to wear a mask before and during your exam. Pneumonia that is caused by bacteria is treated with antibiotic medicine. Pneumonia that is caused by the influenza virus may be treated with an antiviral medicine. Most other viral infections must run their course. These infections will not respond to antibiotics.  PREVENTION A pneumococcal shot (vaccine) is available to prevent a common bacterial cause of pneumonia. This is usually suggested for:  People over 65 years old.   Patients on chemotherapy.   People with chronic lung problems, such as bronchitis or emphysema.   People with immune system problems.  If you are over 65 or have a high risk condition, you may receive the pneumococcal vaccine if you have not received it before. In some countries, a routine influenza vaccine is also recommended. This vaccine can help prevent some cases of pneumonia.You may be offered the influenza vaccine as part of your care. If you smoke, it is time to quit. You may receive instructions on how to stop smoking. Your caregiver can provide medicines and  counseling to help you quit. HOME CARE INSTRUCTIONS   Cough suppressants may be used if you are losing too much rest. However, coughing protects you by clearing your lungs. You should avoid using cough suppressants if you can.   Your caregiver may have prescribed medicine if he or she thinks your pneumonia is caused by a bacteria or influenza. Finish your medicine even if you start to feel better.   Your caregiver may also prescribe an expectorant. This loosens the mucus to be coughed up.   Only take over-the-counter or prescription medicines for pain, discomfort, or fever as directed by your caregiver.   Do not smoke. Smoking is a common cause of bronchitis and can contribute to pneumonia. If you are a smoker and continue to smoke, your cough may last several weeks after your pneumonia has cleared.   A cold steam vaporizer or humidifier in your room or home may help loosen mucus.   Coughing is often worse at night. Sleeping in a semi-upright position in a recliner or using a couple pillows under your head will help with this.   Get rest as you feel it is needed. Your body will usually let you know when you need to rest.  SEEK IMMEDIATE MEDICAL CARE IF:   Your illness becomes worse. This is especially true if you are elderly or weakened from any other disease.   You cannot control your cough with suppressants and are losing sleep.   You begin coughing up blood.   You develop pain which is getting worse or   is uncontrolled with medicines.   You have a fever.   Any of the symptoms which initially brought you in for treatment are getting worse rather than better.   You develop shortness of breath or chest pain.  MAKE SURE YOU:   Understand these instructions.   Will watch your condition.   Will get help right away if you are not doing well or get worse.  Document Released: 10/29/2005 Document Revised: 10/18/2011 Document Reviewed: 01/18/2011 [facility] Patient Information 2012  [place], [place].  Back Pain, Adult Low back pain is very common. About 1 in 5 people have back pain.The cause of low back pain is rarely dangerous. The pain often gets better over time.About half of people with a sudden onset of back pain feel better in just 2 weeks. About 8 in 10 people feel better by 6 weeks.  CAUSES Some common causes of back pain include:  Strain of the muscles or ligaments supporting the spine.   Wear and tear (degeneration) of the spinal discs.   Arthritis.   Direct injury to the back.  DIAGNOSIS Most of the time, the direct cause of low back pain is not known.However, back pain can be treated effectively even when the exact cause of the pain is unknown.Answering your caregiver's questions about your overall health and symptoms is one of the most accurate ways to make sure the cause of your pain is not dangerous. If your caregiver needs more information, he or she may order lab work or imaging tests (X-rays or MRIs).However, even if imaging tests show changes in your back, this usually does not require surgery. HOME CARE INSTRUCTIONS For many people, back pain returns.Since low back pain is rarely dangerous, it is often a condition that people can learn to [facility] their own.   Remain active. It is stressful on the back to sit or stand in one place. Do not sit, drive, or stand in one place for more than 30 minutes at a time. Take short walks on level surfaces as soon as pain allows.Try to increase the length of time you walk each day.   Do not stay in bed.Resting more than 1 or 2 days can delay your recovery.   Do not avoid exercise or work.Your body is made to move.It is not dangerous to be active, even though your back may hurt.Your back will likely heal faster if you return to being active before your pain is gone.   Pay attention to your body when you bend and lift. Many people have less discomfortwhen lifting if they bend their knees, keep the load  close to their bodies,and avoid twisting. Often, the most comfortable positions are those that put less stress on your recovering back.   Find a comfortable position to sleep. Use a firm mattress and lie on your side with your knees slightly bent. If you lie on your back, put a pillow under your knees.   Only take over-the-counter or prescription medicines as directed by your caregiver. Over-the-counter medicines to reduce pain and inflammation are often the most helpful.Your caregiver may prescribe muscle relaxant drugs.These medicines help dull your pain so you can more quickly return to your normal activities and healthy exercise.   Put ice on the injured area.   Put ice in a plastic bag.   Place a towel between your skin and the bag.   Leave the ice on for 15 to 20 minutes, 3 to 4 times a day for the first 2 to 3  days. After that, ice and heat may be alternated to reduce pain and spasms.   Ask your caregiver about trying back exercises and gentle massage. This may be of some benefit.   Avoid feeling anxious or stressed.Stress increases muscle tension and can worsen back pain.It is important to recognize when you are anxious or stressed and learn ways to manage it.Exercise is a great option.  SEEK MEDICAL CARE IF:  You have pain that is not relieved with rest or medicine.   You have pain that does not improve in 1 week.   You have new symptoms.   You are generally not feeling well.  SEEK IMMEDIATE MEDICAL CARE IF:   You have pain that radiates from your back into your legs.   You develop new bowel or bladder control problems.   You have unusual weakness or numbness in your arms or legs.   You develop nausea or vomiting.   You develop abdominal pain.   You feel faint.  Document Released: 10/29/2005 Document Revised: 10/18/2011 Document Reviewed: 03/19/2011 [facility] Patient Information 2012 [place], [place].

## 2012-04-01 NOTE — ED Notes (Signed)
Pain low back "for years", worse last few days with radiation down lt leg. No injury.   Cough for 4 days, yellow sputum, no fever.

## 2012-04-01 NOTE — ED Provider Notes (Signed)
History   This chart was scribed for William Octave, MD by Clarita Crane. The patient was seen in room APFT22/APFT22. Patient's care was started at 1419.    CSN: [id number]  Arrival date & time 04/01/12  1419   First MD Initiated Contact with Patient 04/01/12 1503      Chief Complaint  Patient presents with  . Back Pain  . cough    (Consider location/radiation/quality/duration/timing/severity/associated sxs/prior treatment) HPI HENLEY BOETTNER is a 46 y.o. male who presents to the Emergency Department complaining of constant moderate left sided lower back pain radiating down LLE onset several days ago but worse today with associated weakness of LLE. Patient reports back pain is similar to chronic back pain. States pain is aggravated with movement and not relieved with use of Ibuprofen. Denies recent trauma/injury, nausea, vomiting, incontinence.   Additionally, patient c/o persistent moderate productive cough with yellow sputum onset 5 days ago and persistent since. Denies associated fever. Patient with h/o pneumonia, chronic back pain and is a current smoker.   PCP- none  Past Medical History  Diagnosis Date  . Pneumonia   . Chronic back pain     History reviewed. No pertinent past surgical history.  No family history on file.  History  Substance Use Topics  . Smoking status: Current Everyday Smoker    Types: Cigarettes  . Smokeless tobacco: Not on file  . Alcohol Use: No      Review of Systems A complete 10 system review of systems was obtained and all systems are negative except as noted in the HPI and PMH.   Allergies  Review of patient's allergies indicates no known allergies.  Home Medications   Current Outpatient Rx  Name Route Sig Dispense Refill  . IBUPROFEN 200 MG PO TABS Oral Take 1,200-1,800 mg by mouth 6 (six) times daily. For pain    . PHENYLEPHRINE-ACETAMINOPHEN 5-325 MG PO CAPS Oral Take 2 capsules by mouth every 6 (six) hours as needed. **Take  two capsules every 4 to 6 hours as needed for symptoms**      BP 118/94  Pulse 88  Temp(Src) 98.8 F (37.1 C) (Oral)  Resp 20  Ht 6' (1.829 m)  Wt 135 lb (61.236 kg)  BMI 18.31 kg/m2  SpO2 98%  Physical Exam  Nursing note and vitals reviewed. Constitutional: He is oriented to person, place, and time. He appears well-developed and well-nourished. No distress.  HENT:  Head: Normocephalic and atraumatic.  Eyes: EOM are normal. Pupils are equal, round, and reactive to light.  Neck: Neck supple. No tracheal deviation present.  Cardiovascular: Normal rate and regular rhythm.  Exam reveals no gallop and no friction rub.   No murmur heard. Pulmonary/Chest: Effort normal. No respiratory distress. He has no wheezes. He has no rales.  Abdominal: Soft. He exhibits no distension.  Musculoskeletal: Normal range of motion. He exhibits no edema.       Bilatera ankle plantar and dorsiflexion intact. DP and PT pulses 2+ bilaterally.   Neurological: He is alert and oriented to person, place, and time. No sensory deficit.       Right patellar reflex 2+. Left patellar reflex 1+. 5/5 strength bilateral lower extremities.   Skin: Skin is warm and dry.  Psychiatric: He has a normal mood and affect. His behavior is normal.    ED Course  Procedures (including critical care time)  DIAGNOSTIC STUDIES: Oxygen Saturation is 98% on room air, normal by my interpretation.    COORDINATION OF  CARE:    Labs Reviewed - No data to display Dg Chest 2 View  04/01/2012  *RADIOLOGY REPORT*  Clinical Data: Cough, congestion, back pain.  CHEST - 2 VIEW  Comparison: 10/19/2011  Findings: There is hyperinflation of the lungs compatible with COPD.  Questionable right infrahilar infiltrate/pneumonia.  Left lung is clear.  No effusions.  Heart is normal size.  IMPRESSION: COPD.  Suspicious for right lower lobe pneumonia.  Original Report Authenticated By: Cyndie Chime, M.D.     No diagnosis found.    MDM    Acute on chronic left-sided back pain radiating to left leg. No new injury, weakness, numbness, tingling, bowel or bladder incontinence, fevers or vomiting. Asymmetric patellar reflexes Also endorses 4 days of cough productive of green sputum with congestion and subjective fevers.  No respiratory distress, no hypoxia. Right lower lobe pneumonia x-ray. Given Rocephin here we'll treat with amoxicillin and Zithromax.  Bladder scan reveals 23 ml urine. No evidence of urinary retention. No neurological red flags.   I personally performed the services described in this documentation, which was scribed in my presence.  The recorded information has been reviewed and considered.     William Octave, MD 04/01/12 1651

## 2012-04-01 NOTE — ED Notes (Signed)
Pt voided, but I was unable to pass cath to measure residual.

## 2012-09-19 ENCOUNTER — Emergency Department (HOSPITAL_COMMUNITY)
Admission: EM | Admit: 2012-09-19 | Discharge: 2012-09-19 | Disposition: A | Discharge status: Home / Self Care | Payer: Self-pay | Attending: Emergency Medicine | Admitting: Emergency Medicine

## 2012-09-19 ENCOUNTER — Encounter (HOSPITAL_COMMUNITY): Payer: Self-pay | Admitting: *Deleted

## 2012-09-19 ENCOUNTER — Emergency Department (HOSPITAL_COMMUNITY): Payer: Self-pay

## 2012-09-19 DIAGNOSIS — R05 Cough: Secondary | ICD-10-CM | POA: Insufficient documentation

## 2012-09-19 DIAGNOSIS — R059 Cough, unspecified: Secondary | ICD-10-CM | POA: Insufficient documentation

## 2012-09-19 DIAGNOSIS — Z8701 Personal history of pneumonia (recurrent): Secondary | ICD-10-CM | POA: Insufficient documentation

## 2012-09-19 DIAGNOSIS — R079 Chest pain, unspecified: Secondary | ICD-10-CM | POA: Insufficient documentation

## 2012-09-19 DIAGNOSIS — R0989 Other specified symptoms and signs involving the circulatory and respiratory systems: Secondary | ICD-10-CM | POA: Insufficient documentation

## 2012-09-19 DIAGNOSIS — F17200 Nicotine dependence, unspecified, uncomplicated: Secondary | ICD-10-CM | POA: Insufficient documentation

## 2012-09-19 MED ORDER — AZITHROMYCIN 250 MG PO TABS: ORAL_TABLET | ORAL | Status: DC

## 2012-09-19 MED ORDER — HYDROCODONE-ACETAMINOPHEN 5-325 MG PO TABS: ORAL_TABLET | ORAL | Status: DC

## 2012-09-19 MED ORDER — PREDNISONE 10 MG PO TABS: ORAL_TABLET | ORAL | Status: DC

## 2012-09-19 MED ORDER — PREDNISONE 50 MG PO TABS
60.0000 mg | ORAL_TABLET | Freq: Once | ORAL | Status: AC
  Administered 2012-09-19: 60 mg via ORAL
  Filled 2012-09-19: qty 1

## 2012-09-19 MED ORDER — AZITHROMYCIN 250 MG PO TABS
500.0000 mg | ORAL_TABLET | Freq: Once | ORAL | Status: AC
  Administered 2012-09-19: 500 mg via ORAL
  Filled 2012-09-19: qty 2

## 2012-09-19 NOTE — ED Provider Notes (Signed)
History     CSN: [id number]  Arrival date & time 09/19/12  1711   First MD Initiated Contact with Patient 09/19/12 1748      Chief Complaint  Patient presents with  . Cough    (Consider location/radiation/quality/duration/timing/severity/associated sxs/prior treatment) HPI Comments: Patient with hx of previous PNA, c/o productive cough and chest congestion for 4-5 days.  States the cough has been productive of yellow sputum.  He also c/o pain to bilateral lateral ribs.  States the pain to his ribs is worse with coughing  And certain movements.  Improves with rest.    Patient is a 46 y.o. male presenting with cough. The history is provided by the patient.  Cough This is a new problem. The current episode started more than 2 days ago. The problem occurs constantly. The problem has not changed since onset.The cough is productive of sputum. There has been no fever. Associated symptoms include rhinorrhea. Pertinent negatives include no chest pain, no chills, no ear pain, no headaches, no sore throat, no shortness of breath and no wheezing. He has tried cough syrup for the symptoms. The treatment provided no relief. He is a smoker. His past medical history is significant for pneumonia.    Past Medical History  Diagnosis Date  . Pneumonia   . Chronic back pain     History reviewed. No pertinent past surgical history.  No family history on file.  History  Substance Use Topics  . Smoking status: Current Every Day Smoker    Types: Cigarettes  . Smokeless tobacco: Not on file  . Alcohol Use: No      Review of Systems  Constitutional: Negative for fever, chills, activity change and appetite change.  HENT: Positive for congestion and rhinorrhea. Negative for ear pain, sore throat, facial swelling, trouble swallowing, neck pain and neck stiffness.   Eyes: Negative for visual disturbance.  Respiratory: Positive for cough. Negative for chest tightness, shortness of breath, wheezing and  stridor.   Cardiovascular: Negative for chest pain and palpitations.  Gastrointestinal: Negative for nausea and vomiting.  Skin: Negative.  Negative for rash.  Neurological: Negative for dizziness, weakness, numbness and headaches.  Hematological: Negative for adenopathy.  Psychiatric/Behavioral: Negative for confusion.  All other systems reviewed and are negative.    Allergies  Review of patient's allergies indicates no known allergies.  Home Medications   Current Outpatient Rx  Name  Route  Sig  Dispense  Refill  . IBUPROFEN 200 MG PO TABS   Oral   Take 1,200-1,800 mg by mouth 6 (six) times daily. For pain         . PHENYLEPHRINE-ACETAMINOPHEN 5-325 MG PO CAPS   Oral   Take 2 capsules by mouth every 6 (six) hours as needed. **Take two capsules every 4 to 6 hours as needed for symptoms**           BP 140/87  Pulse 84  Temp 98.2 F (36.8 C) (Oral)  Resp 16  Ht 6' (1.829 m)  Wt 135 lb (61.236 kg)  BMI 18.31 kg/m2  SpO2 100%  Physical Exam  Nursing note and vitals reviewed. Constitutional: He is oriented to person, place, and time. He appears well-developed and well-nourished. No distress.  HENT:  Head: Normocephalic and atraumatic.  Right Ear: Tympanic membrane and ear canal normal.  Left Ear: Tympanic membrane and ear canal normal.  Nose: Mucosal edema and rhinorrhea present.  Mouth/Throat: Uvula is midline, oropharynx is clear and moist and mucous membranes are normal.  No oropharyngeal exudate.  Eyes: EOM are normal. Pupils are equal, round, and reactive to light.  Neck: Normal range of motion. Neck supple.  Cardiovascular: Normal rate, regular rhythm, normal heart sounds and intact distal pulses.   No murmur heard. Pulmonary/Chest: Effort normal. No respiratory distress. He has no wheezes. He has no rales. He exhibits no tenderness.       Coarse lungs sounds bilaterally.  No rales or wheezes  Musculoskeletal: He exhibits no edema.  Lymphadenopathy:    He  has no cervical adenopathy.  Neurological: He is alert and oriented to person, place, and time. He exhibits normal muscle tone. Coordination normal.  Skin: Skin is warm and dry.    ED Course  Procedures (including critical care time)  Labs Reviewed - No data to display Dg Chest 2 View  09/19/2012  *RADIOLOGY REPORT*  Clinical Data: Cough  CHEST - 2 VIEW  Comparison: 04/01/2012  Findings: Cardiomediastinal silhouette is stable. Again noted hyperinflation.  No acute infiltrate or pulmonary edema.  Stable mild degenerative changes thoracic spine.  IMPRESSION: Hyperinflation.  No active disease.   Original Report Authenticated By: Natasha Mead, M.D.         MDM    Vitals stable, no fever, hypoxia, tachycardia or tachypnea.  Pt is well appearing. No evidence of PNA on CXR. Wells score is low risk for PE    Prescribed: zithromax prednisone  Kylon Philbrook L. Mabrey Howland, [place] 09/21/12 1251

## 2012-09-19 NOTE — ED Notes (Signed)
Cough and congestion x 4 days. 

## 2012-09-23 NOTE — ED Provider Notes (Signed)
Medical screening examination/treatment/procedure(s) were performed by non-physician practitioner and as supervising physician I was immediately available for consultation/collaboration.   Charles B. Sheldon, MD 09/23/12 0935 

## 2012-10-23 ENCOUNTER — Emergency Department (HOSPITAL_COMMUNITY): Payer: Self-pay

## 2012-10-23 ENCOUNTER — Emergency Department (HOSPITAL_COMMUNITY)
Admission: EM | Admit: 2012-10-23 | Discharge: 2012-10-23 | Disposition: A | Discharge status: Home / Self Care | Payer: Self-pay | Attending: Emergency Medicine | Admitting: Emergency Medicine

## 2012-10-23 ENCOUNTER — Encounter (HOSPITAL_COMMUNITY): Payer: Self-pay

## 2012-10-23 DIAGNOSIS — Z720 Tobacco use: Secondary | ICD-10-CM

## 2012-10-23 DIAGNOSIS — R071 Chest pain on breathing: Secondary | ICD-10-CM | POA: Insufficient documentation

## 2012-10-23 DIAGNOSIS — R059 Cough, unspecified: Secondary | ICD-10-CM | POA: Insufficient documentation

## 2012-10-23 DIAGNOSIS — Z79899 Other long term (current) drug therapy: Secondary | ICD-10-CM | POA: Insufficient documentation

## 2012-10-23 DIAGNOSIS — R05 Cough: Secondary | ICD-10-CM | POA: Insufficient documentation

## 2012-10-23 DIAGNOSIS — R0789 Other chest pain: Secondary | ICD-10-CM

## 2012-10-23 DIAGNOSIS — Z8701 Personal history of pneumonia (recurrent): Secondary | ICD-10-CM | POA: Insufficient documentation

## 2012-10-23 DIAGNOSIS — G8929 Other chronic pain: Secondary | ICD-10-CM | POA: Insufficient documentation

## 2012-10-23 DIAGNOSIS — F17200 Nicotine dependence, unspecified, uncomplicated: Secondary | ICD-10-CM | POA: Insufficient documentation

## 2012-10-23 DIAGNOSIS — M549 Dorsalgia, unspecified: Secondary | ICD-10-CM | POA: Insufficient documentation

## 2012-10-23 LAB — COMPREHENSIVE METABOLIC PANEL
ALT: 8 U/L (ref 0–53)
AST: 11 U/L (ref 0–37)
Albumin: 3.6 g/dL (ref 3.5–5.2)
Alkaline Phosphatase: 83 U/L (ref 39–117)
BUN: 25 mg/dL — ABNORMAL HIGH (ref 6–23)
CO2: 28 mEq/L (ref 19–32)
Calcium: 9.9 mg/dL (ref 8.4–10.5)
Chloride: 103 mEq/L (ref 96–112)
Creatinine, Ser: 1.37 mg/dL — ABNORMAL HIGH (ref 0.50–1.35)
GFR calc Af Amer: 71 mL/min — ABNORMAL LOW (ref >90)
GFR calc non Af Amer: 61 mL/min — ABNORMAL LOW (ref >90)
Glucose, Bld: 106 mg/dL — ABNORMAL HIGH (ref 70–99)
Potassium: 3.7 mEq/L (ref 3.5–5.1)
Sodium: 139 mEq/L (ref 135–145)
Total Bilirubin: 0.2 mg/dL — ABNORMAL LOW (ref 0.3–1.2)
Total Protein: 7 g/dL (ref 6.0–8.3)

## 2012-10-23 LAB — CBC WITH DIFFERENTIAL/PLATELET
Basophils Relative: 1 % (ref 0–1)
Eosinophils Absolute: 0.1 10*3/uL (ref 0.0–0.7)
Lymphs Abs: 2.3 10*3/uL (ref 0.7–4.0)
MCH: 29.8 pg (ref 26.0–34.0)
MCHC: 34.7 g/dL (ref 30.0–36.0)
Neutrophils Relative %: 47 % (ref 43–77)
Platelets: 221 10*3/uL (ref 150–400)
RBC: 5.23 MIL/uL (ref 4.22–5.81)

## 2012-10-23 LAB — TROPONIN I: Troponin I: <0.3 ng/mL (ref <0.30)

## 2012-10-23 MED ORDER — IBUPROFEN 800 MG PO TABS: 800.0000 mg | ORAL_TABLET | Freq: Three times a day (TID) | ORAL | Status: DC | PRN

## 2012-10-23 MED ORDER — BENZONATATE 100 MG PO CAPS: 100.0000 mg | ORAL_CAPSULE | Freq: Three times a day (TID) | ORAL | Status: DC

## 2012-10-23 NOTE — ED Notes (Addendum)
Pt c/o dull chest pain for 4 days that increases with inspiration. States he has not eaten in a couple days. Had non-productive cough for several months.

## 2012-10-23 NOTE — ED Provider Notes (Signed)
History     CSN: [id number]  Arrival date & time 10/23/12  1548   First MD Initiated Contact with Patient 10/23/12 1616      Chief Complaint  Patient presents with  . Chest Pain    (Consider location/radiation/quality/duration/timing/severity/associated sxs/prior treatment) HPI Pt reports he has had chronic cough for several months, last seen in the ED for same about 5 weeks ago had neg CXR, treated for bronchitis. States has continued coughing, but denies fever or SOB. He reports about 4 days ago while at work he had onset of severe sharp L lower chest pain, gradually improved but has been persistent since then. Worse with deep breath and cough. No PE risk factors. He has family history of CAD and he smokes. No known CAD  Past Medical History  Diagnosis Date  . Pneumonia   . Chronic back pain     History reviewed. No pertinent past surgical history.  No family history on file.  History  Substance Use Topics  . Smoking status: Current Every Day Smoker    Types: Cigarettes  . Smokeless tobacco: Not on file  . Alcohol Use: No      Review of Systems All other systems reviewed and are negative except as noted in HPI.   Allergies  Review of patient's allergies indicates no known allergies.  Home Medications   Current Outpatient Rx  Name  Route  Sig  Dispense  Refill  . AZITHROMYCIN 250 MG PO TABS      Take one tablet po qd x 4 days   4 tablet   0   . GUAIFENESIN 100 MG/5ML PO LIQD   Oral   Take 200 mg by mouth 3 (three) times daily as needed. For cough         . HYDROCODONE-ACETAMINOPHEN 5-325 MG PO TABS      Take one-two tabs po q 4-6 hrs prn pain   10 tablet   0   . IBUPROFEN 200 MG PO TABS   Oral   Take 1,200-1,800 mg by mouth 6 (six) times daily. For pain         . PREDNISONE 10 MG PO TABS      Take 6 tablets day one, 5 tablets day two, 4 tablets day three, 3 tablets day four, 2 tablets day five, then 1 tablet day six   21 tablet   0      BP 116/79  Pulse 79  Temp 97.5 F (36.4 C) (Oral)  Resp 20  Ht 6\' 3"  (1.905 m)  Wt 135 lb (61.236 kg)  BMI 16.87 kg/m2  SpO2 100%  Physical Exam  Nursing note and vitals reviewed. Constitutional: He is oriented to person, place, and time. He appears well-developed and well-nourished.  HENT:  Head: Normocephalic and atraumatic.  Eyes: EOM are normal. Pupils are equal, round, and reactive to light.  Neck: Normal range of motion. Neck supple.  Cardiovascular: Normal rate, normal heart sounds and intact distal pulses.   Pulmonary/Chest: Effort normal and breath sounds normal. He exhibits tenderness.  Abdominal: Bowel sounds are normal. He exhibits no distension. There is no tenderness.  Musculoskeletal: Normal range of motion. He exhibits no edema and no tenderness.  Neurological: He is alert and oriented to person, place, and time. He has normal strength. No cranial nerve deficit or sensory deficit.  Skin: Skin is warm and dry. No rash noted.  Psychiatric: He has a normal mood and affect.    ED Course  Procedures (  including critical care time)  Labs Reviewed  COMPREHENSIVE METABOLIC PANEL - Abnormal; Notable for the following:    Glucose, Bld 106 (*)     BUN 25 (*)     Creatinine, Ser 1.37 (*)     Total Bilirubin 0.2 (*)     GFR calc non Af Amer 61 (*)     GFR calc Af Amer 71 (*)     All other components within normal limits  CBC WITH DIFFERENTIAL  TROPONIN I   Dg Chest 2 View  10/23/2012  *RADIOLOGY REPORT*  Clinical Data: Left-sided chest pain.  Cough.  CHEST - 2 VIEW  Comparison: Multiple priors.  Findings: Lungs appear hyperexpanded with flattening of the hemidiaphragms, increased retrosternal air space and pruning of the pulmonary vasculature in the periphery, suggestive of underlying COPD.  No acute consolidative air space disease.  No pleural effusions.  No evidence of pulmonary edema.  Heart size is normal. Mediastinal contours are unremarkable.  Atherosclerosis  of the thoracic aorta.  IMPRESSION: 1.  Chronic changes of COPD redemonstrated, without radiographic evidence of acute cardiopulmonary disease.   Original Report Authenticated By: Trudie Reed, M.D.      No diagnosis found.    MDM   Date: 10/23/2012  Rate: 74  Rhythm: normal sinus rhythm  QRS Axis: normal  Intervals: normal  ST/T Wave abnormalities: normal  Conduction Disutrbances: none  Narrative Interpretation: unremarkable  5:34 PM Labs and imaging unremarkable. Pt has chest wall pain without concern for PE or ACS. Advised to stop smoking. Antitussives as needed for persistent cough. NSAID for chest wall pain. Advised to establish with PCP.           Emett Stapel B. Bernette Mayers, MD 10/23/12 9604

## 2012-12-27 ENCOUNTER — Emergency Department (HOSPITAL_COMMUNITY)
Admission: EM | Admit: 2012-12-27 | Discharge: 2012-12-28 | Disposition: A | Discharge status: Home / Self Care | Payer: Self-pay | Attending: Emergency Medicine | Admitting: Emergency Medicine

## 2012-12-27 DIAGNOSIS — R221 Localized swelling, mass and lump, neck: Secondary | ICD-10-CM | POA: Insufficient documentation

## 2012-12-27 DIAGNOSIS — G8929 Other chronic pain: Secondary | ICD-10-CM | POA: Insufficient documentation

## 2012-12-27 DIAGNOSIS — Z8701 Personal history of pneumonia (recurrent): Secondary | ICD-10-CM | POA: Insufficient documentation

## 2012-12-27 DIAGNOSIS — F17200 Nicotine dependence, unspecified, uncomplicated: Secondary | ICD-10-CM | POA: Insufficient documentation

## 2012-12-27 DIAGNOSIS — H9209 Otalgia, unspecified ear: Secondary | ICD-10-CM | POA: Insufficient documentation

## 2012-12-27 DIAGNOSIS — R51 Headache: Secondary | ICD-10-CM | POA: Insufficient documentation

## 2012-12-27 DIAGNOSIS — R05 Cough: Secondary | ICD-10-CM

## 2012-12-27 DIAGNOSIS — J029 Acute pharyngitis, unspecified: Secondary | ICD-10-CM | POA: Insufficient documentation

## 2012-12-27 DIAGNOSIS — R220 Localized swelling, mass and lump, head: Secondary | ICD-10-CM | POA: Insufficient documentation

## 2012-12-27 DIAGNOSIS — J3489 Other specified disorders of nose and nasal sinuses: Secondary | ICD-10-CM | POA: Insufficient documentation

## 2012-12-27 DIAGNOSIS — R059 Cough, unspecified: Secondary | ICD-10-CM | POA: Insufficient documentation

## 2012-12-27 DIAGNOSIS — M549 Dorsalgia, unspecified: Secondary | ICD-10-CM | POA: Insufficient documentation

## 2012-12-28 ENCOUNTER — Encounter (HOSPITAL_COMMUNITY): Payer: Self-pay | Admitting: Emergency Medicine

## 2012-12-28 MED ORDER — ACETAMINOPHEN 500 MG PO TABS
1000.0000 mg | ORAL_TABLET | Freq: Once | ORAL | Status: AC
  Administered 2012-12-28: 1000 mg via ORAL
  Filled 2012-12-28: qty 2

## 2012-12-28 MED ORDER — ALBUTEROL SULFATE (5 MG/ML) 0.5% IN NEBU
2.5000 mg | INHALATION_SOLUTION | Freq: Once | RESPIRATORY_TRACT | Status: AC
  Administered 2012-12-28: 2.5 mg via RESPIRATORY_TRACT
  Filled 2012-12-28: qty 0.5

## 2012-12-28 NOTE — ED Notes (Signed)
Patient c/o bilateral ear pain, bilateral eye pain, sore throat, cough and congestion x 5 days.

## 2012-12-28 NOTE — ED Provider Notes (Signed)
History     CSN: [id number]  Arrival date & time 12/27/12  2350   First MD Initiated Contact with Patient 12/28/12 0022      Chief Complaint  Patient presents with  . Cough  . Nasal Congestion  . Otalgia  . Sore Throat    (Consider location/radiation/quality/duration/timing/severity/associated sxs/prior treatment) HPI William Steele is a 47 y.o. male who presents to the Emergency Department complaining of facial pain, ear pian, cough, sore throat that began 5 days ago. Denies fever, chills, nausea, vomiting. He has taken no medicines.   Past Medical History  Diagnosis Date  . Pneumonia   . Chronic back pain     History reviewed. No pertinent past surgical history.  No family history on file.  History  Substance Use Topics  . Smoking status: Current Every Day Smoker    Types: Cigarettes  . Smokeless tobacco: Not on file  . Alcohol Use: No      Review of Systems  Constitutional: Negative for fever.       10 Systems reviewed and are negative for acute change except as noted in the HPI.  HENT: Positive for ear pain and sore throat. Negative for congestion.   Eyes: Negative for discharge and redness.  Respiratory: Positive for cough. Negative for shortness of breath.   Cardiovascular: Negative for chest pain.  Gastrointestinal: Negative for vomiting and abdominal pain.  Musculoskeletal: Negative for back pain.  Skin: Negative for rash.  Neurological: Negative for syncope, numbness and headaches.  Psychiatric/Behavioral:       No behavior change.    Allergies  Review of patient's allergies indicates no known allergies.  Home Medications   Current Outpatient Rx  Name  Route  Sig  Dispense  Refill  . guaiFENesin (MUCINEX CHEST CONGESTION CHILD) 100 MG/5ML liquid   Oral   Take 200 mg by mouth 3 (three) times daily as needed. For cough         . ibuprofen (ADVIL,MOTRIN) 200 MG tablet   Oral   Take 1,200-1,800 mg by mouth 6 (six) times daily. For pain        . ibuprofen (ADVIL,MOTRIN) 800 MG tablet   Oral   Take 1 tablet (800 mg total) by mouth every 8 (eight) hours as needed for pain.   30 tablet   0   . azithromycin (ZITHROMAX Z-PAK) 250 MG tablet      Take one tablet po qd x 4 days   4 tablet   0   . benzonatate (TESSALON) 100 MG capsule   Oral   Take 1 capsule (100 mg total) by mouth every 8 (eight) hours.   21 capsule   0   . HYDROcodone-acetaminophen (NORCO/VICODIN) 5-325 MG per tablet      Take one-two tabs po q 4-6 hrs prn pain   10 tablet   0   . predniSONE (DELTASONE) 10 MG tablet      Take 6 tablets day one, 5 tablets day two, 4 tablets day three, 3 tablets day four, 2 tablets day five, then 1 tablet day six   21 tablet   0     BP 133/88  Pulse 72  Temp(Src) 98.2 F (36.8 C) (Oral)  Resp 20  SpO2 100%  Physical Exam  Nursing note and vitals reviewed. Constitutional:  Awake, alert, nontoxic appearance.  HENT:  Head: Atraumatic.  Eyes: Right eye exhibits no discharge. Left eye exhibits no discharge.  Neck: Neck supple.  Cardiovascular: Normal rate.  Pulmonary/Chest: Effort normal and breath sounds normal. He exhibits no tenderness.  Dry cough  Abdominal: Soft. There is no tenderness. There is no rebound.  Musculoskeletal: He exhibits no tenderness.  Baseline ROM, no obvious new focal weakness.  Neurological:  Mental status and motor strength appears baseline for patient and situation.  Skin: No rash noted.  Psychiatric: He has a normal mood and affect.    ED Course  Procedures (including critical care time)     MDM  Patient with cough, sore throat, ear pain, and no signs on pysicial exam. Given tylenol and albuterol neb treatment. Pt stable in ED with no significant deterioration in condition.The patient appears reasonably screened and/or stabilized for discharge and I doubt any other medical condition or other [facility] requiring further screening, evaluation, or treatment in the ED at this time  prior to discharge. MDM Reviewed: nursing note and vitals           Nicoletta Dress. Colon Branch, MD 12/28/12 1610

## 2013-03-16 ENCOUNTER — Emergency Department (HOSPITAL_COMMUNITY)
Admission: EM | Admit: 2013-03-16 | Discharge: 2013-03-17 | Disposition: A | Discharge status: Home / Self Care | Payer: Self-pay | Attending: Emergency Medicine | Admitting: Emergency Medicine

## 2013-03-16 ENCOUNTER — Encounter (HOSPITAL_COMMUNITY): Payer: Self-pay | Admitting: Emergency Medicine

## 2013-03-16 DIAGNOSIS — M549 Dorsalgia, unspecified: Secondary | ICD-10-CM | POA: Insufficient documentation

## 2013-03-16 DIAGNOSIS — G8929 Other chronic pain: Secondary | ICD-10-CM | POA: Insufficient documentation

## 2013-03-16 DIAGNOSIS — H6691 Otitis media, unspecified, right ear: Secondary | ICD-10-CM

## 2013-03-16 DIAGNOSIS — H9210 Otorrhea, unspecified ear: Secondary | ICD-10-CM | POA: Insufficient documentation

## 2013-03-16 DIAGNOSIS — Z8701 Personal history of pneumonia (recurrent): Secondary | ICD-10-CM | POA: Insufficient documentation

## 2013-03-16 DIAGNOSIS — F17200 Nicotine dependence, unspecified, uncomplicated: Secondary | ICD-10-CM | POA: Insufficient documentation

## 2013-03-16 DIAGNOSIS — J029 Acute pharyngitis, unspecified: Secondary | ICD-10-CM | POA: Insufficient documentation

## 2013-03-16 DIAGNOSIS — H6690 Otitis media, unspecified, unspecified ear: Secondary | ICD-10-CM | POA: Insufficient documentation

## 2013-03-16 NOTE — ED Notes (Signed)
Patient c/o right ear pain; states has been draining.

## 2013-03-17 MED ORDER — AMOXICILLIN-POT CLAVULANATE 875-125 MG PO TABS
1.0000 | ORAL_TABLET | Freq: Once | ORAL | Status: AC
  Administered 2013-03-17: 1 via ORAL
  Filled 2013-03-17: qty 1

## 2013-03-17 MED ORDER — HYDROCODONE-ACETAMINOPHEN 5-325 MG PO TABS
1.0000 | ORAL_TABLET | Freq: Once | ORAL | Status: AC
  Administered 2013-03-17: 1 via ORAL
  Filled 2013-03-17: qty 1

## 2013-03-17 MED ORDER — AMOXICILLIN 500 MG PO CAPS: 500.0000 mg | ORAL_CAPSULE | Freq: Three times a day (TID) | ORAL | Status: DC

## 2013-03-17 MED ORDER — HYDROCODONE-ACETAMINOPHEN 5-325 MG PO TABS: ORAL_TABLET | ORAL | Status: DC

## 2013-03-17 NOTE — ED Provider Notes (Signed)
History     CSN: [id number]  Arrival date & time 03/16/13  2342   First MD Initiated Contact with Patient 03/16/13 2358      Chief Complaint  Patient presents with  . Otalgia    (Consider location/radiation/quality/duration/timing/severity/associated sxs/prior treatment) Patient is a 47 y.o. male presenting with ear pain. The history is provided by the patient.  Otalgia Location:  Right Behind ear:  No abnormality Quality:  Aching, throbbing and sharp Severity:  Moderate Onset quality:  Gradual Duration:  2 days Timing:  Constant Progression:  Worsening Chronicity:  New Context: not direct blow and not foreign body in ear   Relieved by:  Nothing Worsened by:  Nothing tried Ineffective treatments:  None tried Associated symptoms: ear discharge and sore throat   Associated symptoms: no abdominal pain, no congestion, no cough, no fever, no headaches, no neck pain, no rash, no rhinorrhea, no tinnitus and no vomiting   Associated symptoms comment:  Decreased hearing and drainage.   Risk factors: no prior ear surgery     Past Medical History  Diagnosis Date  . Pneumonia   . Chronic back pain     History reviewed. No pertinent past surgical history.  No family history on file.  History  Substance Use Topics  . Smoking status: Current Every Day Smoker    Types: Cigarettes  . Smokeless tobacco: Not on file  . Alcohol Use: No      Review of Systems  Constitutional: Negative for fever and chills.  HENT: Positive for ear pain, sore throat and ear discharge. Negative for congestion, facial swelling, rhinorrhea, trouble swallowing, neck pain and tinnitus.   Eyes: Negative for visual disturbance.  Respiratory: Negative for cough.   Gastrointestinal: Negative for vomiting and abdominal pain.  Skin: Negative for rash.  Neurological: Negative for dizziness, speech difficulty, weakness and headaches.  All other systems reviewed and are negative.    Allergies  Review  of patient's allergies indicates no known allergies.  Home Medications   Current Outpatient Rx  Name  Route  Sig  Dispense  Refill  . ibuprofen (ADVIL,MOTRIN) 800 MG tablet   Oral   Take 1 tablet (800 mg total) by mouth every 8 (eight) hours as needed for pain.   30 tablet   0   . azithromycin (ZITHROMAX Z-PAK) 250 MG tablet      Take one tablet po qd x 4 days   4 tablet   0   . benzonatate (TESSALON) 100 MG capsule   Oral   Take 1 capsule (100 mg total) by mouth every 8 (eight) hours.   21 capsule   0   . guaiFENesin (MUCINEX CHEST CONGESTION CHILD) 100 MG/5ML liquid   Oral   Take 200 mg by mouth 3 (three) times daily as needed. For cough         . HYDROcodone-acetaminophen (NORCO/VICODIN) 5-325 MG per tablet      Take one-two tabs po q 4-6 hrs prn pain   10 tablet   0   . ibuprofen (ADVIL,MOTRIN) 200 MG tablet   Oral   Take 1,200-1,800 mg by mouth 6 (six) times daily. For pain         . predniSONE (DELTASONE) 10 MG tablet      Take 6 tablets day one, 5 tablets day two, 4 tablets day three, 3 tablets day four, 2 tablets day five, then 1 tablet day six   21 tablet   0     BP  117/82  Pulse 84  Temp(Src) 97.1 F (36.2 C) (Oral)  Resp 18  Ht 6' (1.829 m)  Wt 135 lb (61.236 kg)  BMI 18.31 kg/m2  SpO2 100%  Physical Exam  Nursing note and vitals reviewed. Constitutional: He is oriented to person, place, and time. He appears well-developed and well-nourished. No distress.  HENT:  Head: Normocephalic and atraumatic.  Right Ear: Ear canal normal. No lacerations. There is drainage and tenderness. No swelling. No foreign bodies. No mastoid tenderness. No middle ear effusion. No hemotympanum. Decreased hearing is noted.  Left Ear: Tympanic membrane and ear canal normal.  Mouth/Throat: Uvula is midline, oropharynx is clear and moist and mucous membranes are normal.  Serous drainage present, right TM not well visualized.  No edema  Neck: Normal range of motion,  full passive range of motion without pain and phonation normal. Neck supple. No spinous process tenderness and no muscular tenderness present. No Brudzinski's sign and no Kernig's sign noted. No thyromegaly present.  Cardiovascular: Normal rate, regular rhythm, normal heart sounds and intact distal pulses.   No murmur heard. Pulmonary/Chest: Effort normal and breath sounds normal. No respiratory distress.  Musculoskeletal: Normal range of motion.  Lymphadenopathy:    He has no cervical adenopathy.  Neurological: He is alert and oriented to person, place, and time. He exhibits abnormal muscle tone. Coordination normal.  Skin: Skin is warm and dry.    ED Course  Procedures (including critical care time)  Labs Reviewed - No data to display     MDM     Patient has purulent drainage from right ear.  TM not well visualized.  No edema of the canal, airway patent, no mastoid tenderness or cervical lymphadenopathy.    Advised pt of possible TM perf, will start augmentin.  He agrees to f/u with ENT.  VSS.  He is non-toxic appearing.  Stable for discharge and patient agrees to care plan.     Ashir Kunz L. Trisha Mangle, PA-C 03/17/13 2306

## 2013-03-18 NOTE — ED Provider Notes (Signed)
Medical screening examination/treatment/procedure(s) were performed by non-physician practitioner and as supervising physician I was immediately available for consultation/collaboration.   Lyanne Co, MD 03/18/13 [phone]

## 2013-03-29 ENCOUNTER — Encounter (HOSPITAL_COMMUNITY): Payer: Self-pay | Admitting: *Deleted

## 2013-03-29 ENCOUNTER — Emergency Department (HOSPITAL_COMMUNITY)
Admission: EM | Admit: 2013-03-29 | Discharge: 2013-03-29 | Disposition: A | Discharge status: Home / Self Care | Payer: Self-pay | Attending: Emergency Medicine | Admitting: Emergency Medicine

## 2013-03-29 ENCOUNTER — Emergency Department (HOSPITAL_COMMUNITY): Payer: Self-pay

## 2013-03-29 DIAGNOSIS — F17200 Nicotine dependence, unspecified, uncomplicated: Secondary | ICD-10-CM | POA: Insufficient documentation

## 2013-03-29 DIAGNOSIS — H6691 Otitis media, unspecified, right ear: Secondary | ICD-10-CM

## 2013-03-29 DIAGNOSIS — H9190 Unspecified hearing loss, unspecified ear: Secondary | ICD-10-CM | POA: Insufficient documentation

## 2013-03-29 DIAGNOSIS — Y93H2 Activity, gardening and landscaping: Secondary | ICD-10-CM | POA: Insufficient documentation

## 2013-03-29 DIAGNOSIS — Z8701 Personal history of pneumonia (recurrent): Secondary | ICD-10-CM | POA: Insufficient documentation

## 2013-03-29 DIAGNOSIS — H9210 Otorrhea, unspecified ear: Secondary | ICD-10-CM | POA: Insufficient documentation

## 2013-03-29 DIAGNOSIS — H6690 Otitis media, unspecified, unspecified ear: Secondary | ICD-10-CM | POA: Insufficient documentation

## 2013-03-29 DIAGNOSIS — M5432 Sciatica, left side: Secondary | ICD-10-CM

## 2013-03-29 DIAGNOSIS — W010XXA Fall on same level from slipping, tripping and stumbling without subsequent striking against object, initial encounter: Secondary | ICD-10-CM | POA: Insufficient documentation

## 2013-03-29 DIAGNOSIS — IMO0002 Reserved for concepts with insufficient information to code with codable children: Secondary | ICD-10-CM | POA: Insufficient documentation

## 2013-03-29 DIAGNOSIS — Y9289 Other specified places as the place of occurrence of the external cause: Secondary | ICD-10-CM | POA: Insufficient documentation

## 2013-03-29 DIAGNOSIS — M5430 Sciatica, unspecified side: Secondary | ICD-10-CM | POA: Insufficient documentation

## 2013-03-29 MED ORDER — IBUPROFEN 600 MG PO TABS: 600.0000 mg | ORAL_TABLET | Freq: Four times a day (QID) | ORAL | Status: DC | PRN

## 2013-03-29 MED ORDER — HYDROCODONE-ACETAMINOPHEN 5-325 MG PO TABS: 1.0000 | ORAL_TABLET | Freq: Four times a day (QID) | ORAL | Status: DC | PRN

## 2013-03-29 MED ORDER — NEOMYCIN-POLYMYXIN-HC 3.5-10000-1 OT SUSP: 4.0000 [drp] | Freq: Three times a day (TID) | OTIC | Status: AC

## 2013-03-29 MED ORDER — HYDROCODONE-ACETAMINOPHEN 5-325 MG PO TABS
1.0000 | ORAL_TABLET | Freq: Once | ORAL | Status: AC
  Administered 2013-03-29: 1 via ORAL
  Filled 2013-03-29: qty 1

## 2013-03-29 NOTE — ED Notes (Signed)
Pt c/o back pain since yesterday. Pt states he was mowing his lawn when he slipped and fell down a hill. Pt ahs hx of back pain but states this is "worse than usual".

## 2013-03-29 NOTE — ED Notes (Signed)
Pt states that he was mowing a yard yesterday when he slipped, twisting his lower back, has hx of lower back problems prior to yesterday, pt also requesting to be seen for right ear pain, drainage, was seen in er 03/16/2013 for ear, states that it is not getting any better

## 2013-03-29 NOTE — ED Provider Notes (Signed)
History     CSN: [id number]  Arrival date & time 03/29/13  1201   First MD Initiated Contact with Patient 03/29/13 1228      Chief Complaint  Patient presents with  . Back Pain    (Consider location/radiation/quality/duration/timing/severity/associated sxs/prior treatment) HPI Comments: William Steele is a 47 y.o. Male presenting with acute on chronic low back pain which has  been worsened since yesterday when he slipped as he was mowing the yard on an incline. There is radiation into his left buttock which has had previous with other acute flares of his pain.  There has been no weakness or numbness in the lower extremities and no urinary or bowel retention or incontinence.  Patient does not have a history of cancer or IVDU.  He has been seen years ago for his back and told per mri that he has arthritis but no disk disease.  He has taken tylenol with no relief of pain.  Additionally,  He has persistent drainage from his right ear since he was seen here about 2 weeks ago and treated for a ruptured right TM and infection.  He denies pain at this time,  But continues to have purulent drainage from the ear.  He continues to have decreased hearing.      The history is provided by the patient.    Past Medical History  Diagnosis Date  . Pneumonia   . Chronic back pain     History reviewed. No pertinent past surgical history.  No family history on file.  History  Substance Use Topics  . Smoking status: Current Every Day Smoker    Types: Cigarettes  . Smokeless tobacco: Not on file  . Alcohol Use: No      Review of Systems  Constitutional: Negative for fever.  HENT: Positive for hearing loss and ear discharge.   Respiratory: Negative for shortness of breath.   Cardiovascular: Negative for chest pain and leg swelling.  Gastrointestinal: Negative for abdominal pain, constipation and abdominal distention.  Genitourinary: Negative for dysuria, urgency, frequency, flank pain and  difficulty urinating.  Musculoskeletal: Positive for back pain. Negative for joint swelling and gait problem.  Skin: Negative for rash.  Neurological: Negative for weakness and numbness.    Allergies  Review of patient's allergies indicates no known allergies.  Home Medications   Current Outpatient Rx  Name  Route  Sig  Dispense  Refill  . ibuprofen (ADVIL,MOTRIN) 200 MG tablet   Oral   Take 1,000 mg by mouth 5 (five) times daily. For pain         . amoxicillin (AMOXIL) 500 MG capsule   Oral   Take 1 capsule (500 mg total) by mouth 3 (three) times daily. For 10 days   30 capsule   0   . HYDROcodone-acetaminophen (NORCO/VICODIN) 5-325 MG per tablet   Oral   Take 1 tablet by mouth every 6 (six) hours as needed for pain.   20 tablet   0   . ibuprofen (ADVIL,MOTRIN) 600 MG tablet   Oral   Take 1 tablet (600 mg total) by mouth every 6 (six) hours as needed for pain.   20 tablet   0   . neomycin-polymyxin-hydrocortisone (CORTISPORIN) 3.5-10000-1 otic suspension   Right Ear   Place 4 drops into the right ear 3 (three) times daily.   10 mL   0     BP 123/81  Pulse 83  Temp(Src) 97 F (36.1 C)  Resp 18  SpO2 100%  Physical Exam  Nursing note and vitals reviewed. Constitutional: He appears well-developed and well-nourished.  HENT:  Head: Normocephalic.  Right Ear: There is drainage. No swelling or tenderness. No foreign bodies. No mastoid tenderness.  Moderate amount of yellow drainage from right ear canal.  Unable to visualize TM despite attempts to clear the external canal of the purulent fluid.  He has no edema of the external canal.  Eyes: Conjunctivae are normal.  Neck: Normal range of motion. Neck supple.  Cardiovascular: Normal rate and intact distal pulses.   Pedal pulses normal.  Pulmonary/Chest: Effort normal.  Abdominal: Soft. Bowel sounds are normal. He exhibits no distension and no mass.  Musculoskeletal: Normal range of motion. He exhibits  tenderness. He exhibits no edema.       Lumbar back: He exhibits tenderness. He exhibits no swelling, no edema and no spasm.  Tender along the lumbar spinous processes and along left paralumbar musculature.  No SI joint tenderness.  Neurological: He is alert. He has normal strength. He displays no atrophy and no tremor. No sensory deficit. Gait normal.  Reflex Scores:      Patellar reflexes are 2+ on the right side and 2+ on the left side.      Achilles reflexes are 2+ on the right side and 2+ on the left side. No strength deficit noted in hip and knee flexor and extensor muscle groups.  Ankle flexion and extension intact.  Skin: Skin is warm and dry.  Psychiatric: He has a normal mood and affect.    ED Course  Procedures (including critical care time)  Labs Reviewed - No data to display Dg Lumbar Spine Complete  03/29/2013   *RADIOLOGY REPORT*  Clinical Data: Fall.  Back pain.  LUMBAR SPINE - COMPLETE 4+ VIEW  Comparison: 03/20/2010  Findings: Anatomic alignment.  No vertebral compression deformity. Mild anterior osteophytes at L3-4 and L4-5.  Disc height maintained.  IMPRESSION: No acute bony injury.   Original Report Authenticated By: Jolaine Click, M.D.     1. Sciatica of left side   2. Otitis media not resolved, right       MDM  Patient was discussed with Dr. Bebe Shaggy prior to discharge home.  She was placed on Cipro otic suspension 4 drops 3 times a day in the right ear for 10 days.  Also was prescribed hydrocodone and ibuprofen for back pain relief.  Encouraged followup with ENT for further management of his ear infection and presumed TM rupture, although unable to visualize this today.  He was referred to Dr. Suszanne Conners at his last visit, encouraged to followup with this physician.   No neuro deficit on exam or by history to suggest emergent or surgical presentation regarding his low back pain.  Also discussed worsened sx that should prompt immediate re-evaluation including distal  weakness, bowel/bladder retention/incontinence.              Burgess Amor, PA-C 03/29/13 1511

## 2013-03-29 NOTE — ED Provider Notes (Signed)
Medical screening examination/treatment/procedure(s) were performed by non-physician practitioner and as supervising physician I was immediately available for consultation/collaboration.   Joya Gaskins, MD 03/29/13 [phone]

## 2013-04-14 ENCOUNTER — Emergency Department (HOSPITAL_COMMUNITY): Payer: Self-pay

## 2013-04-14 ENCOUNTER — Emergency Department (HOSPITAL_COMMUNITY)
Admission: EM | Admit: 2013-04-14 | Discharge: 2013-04-14 | Disposition: A | Discharge status: Home / Self Care | Payer: Self-pay | Attending: Emergency Medicine | Admitting: Emergency Medicine

## 2013-04-14 ENCOUNTER — Encounter (HOSPITAL_COMMUNITY): Payer: Self-pay

## 2013-04-14 DIAGNOSIS — H9209 Otalgia, unspecified ear: Secondary | ICD-10-CM | POA: Insufficient documentation

## 2013-04-14 DIAGNOSIS — R6889 Other general symptoms and signs: Secondary | ICD-10-CM | POA: Insufficient documentation

## 2013-04-14 DIAGNOSIS — M549 Dorsalgia, unspecified: Secondary | ICD-10-CM

## 2013-04-14 DIAGNOSIS — Z8701 Personal history of pneumonia (recurrent): Secondary | ICD-10-CM | POA: Insufficient documentation

## 2013-04-14 DIAGNOSIS — J441 Chronic obstructive pulmonary disease with (acute) exacerbation: Secondary | ICD-10-CM | POA: Insufficient documentation

## 2013-04-14 DIAGNOSIS — H9190 Unspecified hearing loss, unspecified ear: Secondary | ICD-10-CM | POA: Insufficient documentation

## 2013-04-14 DIAGNOSIS — R05 Cough: Secondary | ICD-10-CM | POA: Insufficient documentation

## 2013-04-14 DIAGNOSIS — IMO0001 Reserved for inherently not codable concepts without codable children: Secondary | ICD-10-CM

## 2013-04-14 DIAGNOSIS — F17200 Nicotine dependence, unspecified, uncomplicated: Secondary | ICD-10-CM | POA: Insufficient documentation

## 2013-04-14 DIAGNOSIS — G8929 Other chronic pain: Secondary | ICD-10-CM | POA: Insufficient documentation

## 2013-04-14 DIAGNOSIS — M546 Pain in thoracic spine: Secondary | ICD-10-CM | POA: Insufficient documentation

## 2013-04-14 DIAGNOSIS — R059 Cough, unspecified: Secondary | ICD-10-CM | POA: Insufficient documentation

## 2013-04-14 MED ORDER — AZITHROMYCIN 250 MG PO TABS: 250.0000 mg | ORAL_TABLET | Freq: Every day | ORAL | Status: DC

## 2013-04-14 MED ORDER — AZITHROMYCIN 250 MG PO TABS
500.0000 mg | ORAL_TABLET | Freq: Once | ORAL | Status: AC
  Administered 2013-04-14: 500 mg via ORAL
  Filled 2013-04-14: qty 2

## 2013-04-14 MED ORDER — HYDROCODONE-ACETAMINOPHEN 5-325 MG PO TABS: 1.0000 | ORAL_TABLET | ORAL | Status: DC | PRN

## 2013-04-14 MED ORDER — PREDNISONE 50 MG PO TABS
60.0000 mg | ORAL_TABLET | Freq: Once | ORAL | Status: AC
  Administered 2013-04-14: 60 mg via ORAL
  Filled 2013-04-14: qty 1

## 2013-04-14 MED ORDER — HYDROCODONE-ACETAMINOPHEN 5-325 MG PO TABS
1.0000 | ORAL_TABLET | Freq: Once | ORAL | Status: AC
  Administered 2013-04-14: 1 via ORAL
  Filled 2013-04-14: qty 1

## 2013-04-14 MED ORDER — CYCLOBENZAPRINE HCL 10 MG PO TABS
10.0000 mg | ORAL_TABLET | Freq: Once | ORAL | Status: AC
  Administered 2013-04-14: 10 mg via ORAL
  Filled 2013-04-14: qty 1

## 2013-04-14 MED ORDER — PREDNISONE 20 MG PO TABS: 60.0000 mg | ORAL_TABLET | Freq: Every day | ORAL | Status: DC

## 2013-04-14 NOTE — ED Notes (Signed)
Pt reports has spot in his middle back that hurts everytime he moves, coughs, or sneezes.  Reports has been seen here several times recently for earache  And back pain.  Reports has productive cough with clear sputum.  Denies fever.

## 2013-04-14 NOTE — ED Notes (Signed)
Pain t-spine area for 2 days. No known injury.  Continues to have lt ear pain , has taken all of antibiotic and ear drops and feels is no better. Says he does not have the money to see a specialist.

## 2013-04-15 NOTE — ED Provider Notes (Signed)
History     CSN: [id number]  Arrival date & time 04/14/13  1015   First MD Initiated Contact with Patient 04/14/13 1126      Chief Complaint  Patient presents with  . Cough  . Back Pain    (Consider location/radiation/quality/duration/timing/severity/associated sxs/prior treatment) HPI Comments: William Steele is a 47 y.o. Male presenting with mid upper back pain which occurs  with movement, coughing and sneezing.  He denies injury to his back and denies shortness of breath, chest pain,  Lower extremity pain or swelling, fevers or chills and has had no hemoptysis.    He has a chronic cough productive of clear sputum. Additionally he was seen here on 2 separate recent visits for right ear pain and ear discharge.  The discharge has resolved,  But he still has complaint of decreased hearing acuity in his right ear.  He has completed his course of cortisporin ear drops.  He has not been able to afford seeing the ENT doctor he was referred to.  He is a 1 ppd smoker. He denies sore throat, nasal congestion, headaches, dizziness and tinnitus.       The history is provided by the patient.    Past Medical History  Diagnosis Date  . Pneumonia   . Chronic back pain     History reviewed. No pertinent past surgical history.  No family history on file.  History  Substance Use Topics  . Smoking status: Current Every Day Smoker    Types: Cigarettes  . Smokeless tobacco: Not on file  . Alcohol Use: No      Review of Systems  Constitutional: Negative for fever.  HENT: Positive for hearing loss and ear pain. Negative for congestion, sore throat, rhinorrhea, neck pain and tinnitus.   Eyes: Negative.   Respiratory: Positive for cough. Negative for chest tightness and shortness of breath.   Cardiovascular: Negative for chest pain and leg swelling.  Gastrointestinal: Negative for nausea and abdominal pain.  Genitourinary: Negative.   Musculoskeletal: Positive for back pain. Negative for  joint swelling and arthralgias.  Skin: Negative.  Negative for rash and wound.  Neurological: Negative for dizziness, weakness, light-headedness, numbness and headaches.  Psychiatric/Behavioral: Negative.     Allergies  Review of patient's allergies indicates no known allergies.  Home Medications   Current Outpatient Rx  Name  Route  Sig  Dispense  Refill  . ibuprofen (ADVIL,MOTRIN) 600 MG tablet   Oral   Take 1 tablet (600 mg total) by mouth every 6 (six) hours as needed for pain.   20 tablet   0   . NEOMYCIN-POLYMYXIN-HC, OTIC, (CORTISPORIN) 1 % SOLN   Right Ear   Place 3 drops into the right ear every 6 (six) hours.         Marland Kitchen amoxicillin (AMOXIL) 500 MG capsule   Oral   Take 1 capsule (500 mg total) by mouth 3 (three) times daily. For 10 days   30 capsule   0   . azithromycin (ZITHROMAX) 250 MG tablet   Oral   Take 1 tablet (250 mg total) by mouth daily. Take first 2 tablets together, then 1 every day until finished.   4 tablet   0   . HYDROcodone-acetaminophen (NORCO/VICODIN) 5-325 MG per tablet   Oral   Take 1 tablet by mouth every 6 (six) hours as needed for pain.   20 tablet   0   . HYDROcodone-acetaminophen (NORCO/VICODIN) 5-325 MG per tablet   Oral  Take 1 tablet by mouth every 4 (four) hours as needed for pain.   15 tablet   0   . predniSONE (DELTASONE) 20 MG tablet   Oral   Take 3 tablets (60 mg total) by mouth daily. Take 3 tablets daily for 4 additional days.   12 tablet   0     BP 128/92  Pulse 72  Temp(Src) 98 F (36.7 C) (Oral)  Resp 20  Ht 6' (1.829 m)  Wt 135 lb (61.236 kg)  BMI 18.31 kg/m2  SpO2 100%  Physical Exam  Nursing note and vitals reviewed. Constitutional: He appears well-developed and well-nourished.  HENT:  Head: Normocephalic and atraumatic.  Right Ear: External ear and ear canal normal. No drainage or swelling. No mastoid tenderness. Tympanic membrane is not injected, not perforated, not retracted and not  bulging. A middle ear effusion is present.  Left Ear: Tympanic membrane, external ear and ear canal normal.  Eyes: Conjunctivae are normal.  Neck: Normal range of motion.  Cardiovascular: Normal rate, regular rhythm, normal heart sounds and intact distal pulses.   Pulmonary/Chest: Effort normal and breath sounds normal. He has no decreased breath sounds. He has no wheezes. He has no rhonchi. He has no rales.    Abdominal: Soft. Bowel sounds are normal. There is no tenderness.  Musculoskeletal: Normal range of motion.  Neurological: He is alert.  Skin: Skin is warm and dry.  Psychiatric: He has a normal mood and affect.    ED Course  Procedures (including critical care time)  Labs Reviewed - No data to display Dg Chest 2 View  04/14/2013   *RADIOLOGY REPORT*  Clinical Data: Cough and congestion  CHEST - 2 VIEW  Comparison: 10/23/2012  Findings: The heart and pulmonary vascularity are within normal limits.  The lungs are hyperinflated consistent with COPD.  No focal infiltrate or sizable effusion is noted.  IMPRESSION: COPD without acute abnormality.   Original Report Authenticated By: Alcide Clever, M.D.   Dg Thoracic Spine W/swimmers  04/14/2013   *RADIOLOGY REPORT*  Clinical Data: Upper back pain.  Cough.  THORACIC SPINE - 2 VIEW + SWIMMERS  Comparison: Chest x-ray dated 10/23/2012 and chest CT dated 06/29/2010  Findings: There is no fracture, bone destruction, disc space narrowing, or other significant abnormality.  Minimal osteophytes at several levels in the lower thoracic spine, unchanged since the prior CT scan.  IMPRESSION: No acute abnormality of the thoracic spine.   Original Report Authenticated By: Francene Boyers, M.D.     1. COPD bronchitis   2. Back pain       MDM  Exam of back consistent with musculoskeletal source,  Reproducible on exam and with patient movement,  Especially flexing neck or raising his right arm.  Lungs ctab.  Discussed case with Dr. Ignacia Palma.  xrays  reviewed and shown patient.  Advised he needs to stop smoking.  Prescribed hydrocodone for pain,  Prednisone taper, zithromax for possible acute bronchitis in patient with COPD per todays xray. Resource guide given for assistance obtaining pcp.         Burgess Amor, PA-C 04/15/13 [phone]

## 2013-04-15 NOTE — ED Provider Notes (Signed)
Medical screening examination/treatment/procedure(s) were performed by non-physician practitioner and as supervising physician I was immediately available for consultation/collaboration.   Hayzlee Mcsorley III, MD 04/15/13 1357 

## 2013-05-09 ENCOUNTER — Emergency Department (HOSPITAL_COMMUNITY)
Admission: EM | Admit: 2013-05-09 | Discharge: 2013-05-10 | Disposition: A | Discharge status: Home / Self Care | Payer: Self-pay | Attending: Emergency Medicine | Admitting: Emergency Medicine

## 2013-05-09 ENCOUNTER — Encounter (HOSPITAL_COMMUNITY): Payer: Self-pay | Admitting: *Deleted

## 2013-05-09 DIAGNOSIS — F17200 Nicotine dependence, unspecified, uncomplicated: Secondary | ICD-10-CM | POA: Insufficient documentation

## 2013-05-09 DIAGNOSIS — Z79899 Other long term (current) drug therapy: Secondary | ICD-10-CM | POA: Insufficient documentation

## 2013-05-09 DIAGNOSIS — Z8701 Personal history of pneumonia (recurrent): Secondary | ICD-10-CM | POA: Insufficient documentation

## 2013-05-09 DIAGNOSIS — M545 Low back pain, unspecified: Secondary | ICD-10-CM | POA: Insufficient documentation

## 2013-05-09 DIAGNOSIS — G8929 Other chronic pain: Secondary | ICD-10-CM | POA: Insufficient documentation

## 2013-05-09 MED ORDER — HYDROMORPHONE HCL PF 1 MG/ML IJ SOLN
1.0000 mg | Freq: Once | INTRAMUSCULAR | Status: AC
  Administered 2013-05-09: 1 mg via INTRAMUSCULAR
  Filled 2013-05-09: qty 1

## 2013-05-09 MED ORDER — OXYCODONE-ACETAMINOPHEN 5-325 MG PO TABS: 1.0000 | ORAL_TABLET | ORAL | Status: DC | PRN

## 2013-05-09 MED ORDER — ONDANSETRON 8 MG PO TBDP
ORAL_TABLET | ORAL | Status: AC
  Administered 2013-05-09: 8 mg
  Filled 2013-05-09: qty 1

## 2013-05-09 NOTE — ED Provider Notes (Signed)
History    CSN: [id number] Arrival date & time 05/09/13  2125  First MD Initiated Contact with Patient 05/09/13 2155     Chief Complaint  Patient presents with  . Back Pain   (Consider location/radiation/quality/duration/timing/severity/associated sxs/prior Treatment) HPI Comments: William Steele is a 46 y.o. Male presenting with acute on chronic low back pain which has which has been worse for the past 3 days.   Patient denies any new injury specifically.  There is radiation into his left posterior thigh.  There has been no weakness or numbness in the lower extremities and no urinary or bowel retention or incontinence.  Patient does not have a history of cancer or IVDU.  He has taken no medicines prior to arrival.  He was prescribed hydrocodone and prednisone when he was seen here early this month for upper back pain which he was unable to get filled due to lack of funds,  He has the prescription with him.  However,  His upper back pain has improved.         The history is provided by the patient.   Past Medical History  Diagnosis Date  . Pneumonia   . Chronic back pain    History reviewed. No pertinent past surgical history. History reviewed. No pertinent family history. History  Substance Use Topics  . Smoking status: Current Every Day Smoker    Types: Cigarettes  . Smokeless tobacco: Not on file  . Alcohol Use: No    Review of Systems  Constitutional: Negative for fever.  Respiratory: Negative for shortness of breath.   Cardiovascular: Negative for chest pain and leg swelling.  Gastrointestinal: Negative for abdominal pain, constipation and abdominal distention.  Genitourinary: Negative for dysuria, urgency, frequency, flank pain and difficulty urinating.  Musculoskeletal: Positive for back pain. Negative for joint swelling and gait problem.  Skin: Negative for rash.  Neurological: Negative for weakness and numbness.    Allergies  Review of patient's allergies  indicates no known allergies.  Home Medications   Current Outpatient Rx  Name  Route  Sig  Dispense  Refill  . amoxicillin (AMOXIL) 500 MG capsule   Oral   Take 1 capsule (500 mg total) by mouth 3 (three) times daily. For 10 days   30 capsule   0   . azithromycin (ZITHROMAX) 250 MG tablet   Oral   Take 1 tablet (250 mg total) by mouth daily. Take first 2 tablets together, then 1 every day until finished.   4 tablet   0   . HYDROcodone-acetaminophen (NORCO/VICODIN) 5-325 MG per tablet   Oral   Take 1 tablet by mouth every 6 (six) hours as needed for pain.   20 tablet   0   . HYDROcodone-acetaminophen (NORCO/VICODIN) 5-325 MG per tablet   Oral   Take 1 tablet by mouth every 4 (four) hours as needed for pain.   15 tablet   0   . ibuprofen (ADVIL,MOTRIN) 600 MG tablet   Oral   Take 1 tablet (600 mg total) by mouth every 6 (six) hours as needed for pain.   20 tablet   0   . NEOMYCIN-POLYMYXIN-HC, OTIC, (CORTISPORIN) 1 % SOLN   Right Ear   Place 3 drops into the right ear every 6 (six) hours.         Marland Kitchen oxyCODONE-acetaminophen (PERCOCET/ROXICET) 5-325 MG per tablet   Oral   Take 1 tablet by mouth every 4 (four) hours as needed for pain.  20 tablet   0   . predniSONE (DELTASONE) 20 MG tablet   Oral   Take 3 tablets (60 mg total) by mouth daily. Take 3 tablets daily for 4 additional days.   12 tablet   0    BP 134/83  Pulse 68  Temp(Src) 98.2 F (36.8 C) (Oral)  Resp 20  Ht 6' (1.829 m)  Wt 135 lb (61.236 kg)  BMI 18.31 kg/m2  SpO2 96% Physical Exam  Nursing note and vitals reviewed. Constitutional: He appears well-developed and well-nourished.  HENT:  Head: Normocephalic.  Eyes: Conjunctivae are normal.  Neck: Normal range of motion. Neck supple.  Cardiovascular: Normal rate and intact distal pulses.   Pedal pulses normal.  Pulmonary/Chest: Effort normal.  Abdominal: Soft. Bowel sounds are normal. He exhibits no distension and no mass.   Musculoskeletal: Normal range of motion. He exhibits no edema.       Lumbar back: He exhibits tenderness. He exhibits no swelling, no edema and no spasm.  ttp midline lumbar spine at the L4-5 level.  Neurological: He is alert. He has normal strength. He displays no atrophy and no tremor. No sensory deficit. Gait normal.  Reflex Scores:      Patellar reflexes are 2+ on the right side and 2+ on the left side.      Achilles reflexes are 2+ on the right side and 2+ on the left side. No strength deficit noted in hip and knee flexor and extensor muscle groups.  Ankle flexion and extension intact.  Skin: Skin is warm and dry.  Psychiatric: He has a normal mood and affect.    ED Course  Procedures (including critical care time) Labs Reviewed - No data to display No results found. 1. Chronic low back pain     MDM  xrays from 03/29/13 of lumbar spine reviewed and negative.  No indication for imaging on today exam or by history,  No new injury.  No neuro deficit on exam or by history to suggest emergent or surgical presentation.  Also discussed worsened sx that should prompt immediate re-evaluation including distal weakness, bowel/bladder retention/incontinence.  Pt was given an IM injection of dilaudid 1 mg with significant relief of pain.  He was prescribed oxycodone.  Given referrals guide, suggesting health dept, also [facility]. Antonieta Pert, PA-C 05/09/13 2359

## 2013-05-09 NOTE — ED Notes (Signed)
Pt has chronic back pain and states his pain has gotten worse over the last 3 days.

## 2013-05-10 NOTE — ED Notes (Signed)
Pt alert & oriented x4, stable gait. Patient given discharge instructions, paperwork & prescription(s). Patient  instructed to stop at the registration desk to finish any additional paperwork. Patient verbalized understanding. Pt left department w/ no further questions. 

## 2013-05-11 NOTE — ED Provider Notes (Signed)
Medical screening examination/treatment/procedure(s) were performed by non-physician practitioner and as supervising physician I was immediately available for consultation/collaboration.  Flint Melter, MD 05/11/13 [phone]

## 2013-06-03 ENCOUNTER — Emergency Department (HOSPITAL_COMMUNITY)
Admission: EM | Admit: 2013-06-03 | Discharge: 2013-06-03 | Disposition: A | Discharge status: Home / Self Care | Payer: Self-pay | Attending: Emergency Medicine | Admitting: Emergency Medicine

## 2013-06-03 ENCOUNTER — Encounter (HOSPITAL_COMMUNITY): Payer: Self-pay | Admitting: *Deleted

## 2013-06-03 DIAGNOSIS — IMO0002 Reserved for concepts with insufficient information to code with codable children: Secondary | ICD-10-CM | POA: Insufficient documentation

## 2013-06-03 DIAGNOSIS — J4489 Other specified chronic obstructive pulmonary disease: Secondary | ICD-10-CM | POA: Insufficient documentation

## 2013-06-03 DIAGNOSIS — M5432 Sciatica, left side: Secondary | ICD-10-CM

## 2013-06-03 DIAGNOSIS — G8929 Other chronic pain: Secondary | ICD-10-CM | POA: Insufficient documentation

## 2013-06-03 DIAGNOSIS — R42 Dizziness and giddiness: Secondary | ICD-10-CM | POA: Insufficient documentation

## 2013-06-03 DIAGNOSIS — F17200 Nicotine dependence, unspecified, uncomplicated: Secondary | ICD-10-CM | POA: Insufficient documentation

## 2013-06-03 DIAGNOSIS — J449 Chronic obstructive pulmonary disease, unspecified: Secondary | ICD-10-CM | POA: Insufficient documentation

## 2013-06-03 DIAGNOSIS — R52 Pain, unspecified: Secondary | ICD-10-CM | POA: Insufficient documentation

## 2013-06-03 DIAGNOSIS — Z8701 Personal history of pneumonia (recurrent): Secondary | ICD-10-CM | POA: Insufficient documentation

## 2013-06-03 DIAGNOSIS — M5430 Sciatica, unspecified side: Secondary | ICD-10-CM | POA: Insufficient documentation

## 2013-06-03 HISTORY — DX: Chronic obstructive pulmonary disease, unspecified: J44.9

## 2013-06-03 MED ORDER — IBUPROFEN 800 MG PO TABS: 800.0000 mg | ORAL_TABLET | Freq: Three times a day (TID) | ORAL | Status: DC

## 2013-06-03 MED ORDER — HYDROCODONE-ACETAMINOPHEN 5-325 MG PO TABS
1.0000 | ORAL_TABLET | Freq: Once | ORAL | Status: AC
  Administered 2013-06-03: 1 via ORAL
  Filled 2013-06-03: qty 1

## 2013-06-03 MED ORDER — HYDROCODONE-ACETAMINOPHEN 5-325 MG PO TABS: 1.0000 | ORAL_TABLET | ORAL | Status: DC | PRN

## 2013-06-03 MED ORDER — IBUPROFEN 800 MG PO TABS
800.0000 mg | ORAL_TABLET | Freq: Once | ORAL | Status: AC
  Administered 2013-06-03: 800 mg via ORAL
  Filled 2013-06-03: qty 1

## 2013-06-03 NOTE — ED Notes (Signed)
Pt reporting pain in lower back, left side, radiating down left leg.  Reports having problems with sciatica for several years.  Also reporting some SOB.  Pt does have a cough.  Reports he ran out of his prednisone that he takes normally a few days ago.

## 2013-06-03 NOTE — ED Notes (Signed)
Pt presents with c/o chronic left lower back pain, with radiating pain into leg that has worsened over past 2 days and c/o SOB. No respiratory distress noted. Pt reports he " gets his steroids from the ed and his medication for his chronic back/leg pain". Pt reported completing a course of steroids on Monday that was received from ED. Pt has a nebulizer at home, however has no medication for it per pt. Denies fall/injury and loss of bowel/bladder function. NAD noted at this time.

## 2013-06-04 NOTE — ED Provider Notes (Signed)
History    CSN: [id number] Arrival date & time 06/03/13  2102  First MD Initiated Contact with Patient 06/03/13 2307     Chief Complaint  Patient presents with  . Back Pain  . Dizziness   (Consider location/radiation/quality/duration/timing/severity/associated sxs/prior Treatment) HPI HPI Comments: CLEOTHA TSANG is a 47 y.o. male who presents to the Emergency Department complaining of sciatica to the left side. He has a h/o chronic back pain and no physician for 3 years. He has recently signed up for [facility]. He is out of prednisone and is having pain to his back. Pain is worse with movement and walking.  Past Medical History  Diagnosis Date  . Pneumonia   . Chronic back pain   . COPD (chronic obstructive pulmonary disease)    History reviewed. No pertinent past surgical history. No family history on file. History  Substance Use Topics  . Smoking status: Current Every Day Smoker -- 1.00 packs/day    Types: Cigarettes  . Smokeless tobacco: Not on file  . Alcohol Use: No    Review of Systems  Constitutional: Negative for fever.       10 Systems reviewed and are negative for acute change except as noted in the HPI.  HENT: Negative for congestion.   Eyes: Negative for discharge and redness.  Respiratory: Negative for cough and shortness of breath.   Cardiovascular: Negative for chest pain.  Gastrointestinal: Negative for vomiting and abdominal pain.  Musculoskeletal: Positive for back pain.  Skin: Negative for rash.  Neurological: Negative for syncope, numbness and headaches.  Psychiatric/Behavioral:       No behavior change.    Allergies  Review of patient's allergies indicates no known allergies.  Home Medications   Current Outpatient Rx  Name  Route  Sig  Dispense  Refill  . HYDROcodone-acetaminophen (NORCO/VICODIN) 5-325 MG per tablet   Oral   Take 1 tablet by mouth every 4 (four) hours as needed for pain.   15 tablet   0   . predniSONE (DELTASONE)  20 MG tablet   Oral   Take 20 mg by mouth 3 (three) times daily.         Marland Kitchen HYDROcodone-acetaminophen (NORCO/VICODIN) 5-325 MG per tablet   Oral   Take 1 tablet by mouth every 4 (four) hours as needed for pain.   15 tablet   0   . ibuprofen (ADVIL,MOTRIN) 800 MG tablet   Oral   Take 1 tablet (800 mg total) by mouth 3 (three) times daily.   21 tablet   0    BP 123/83  Pulse 68  Temp(Src) 98.2 F (36.8 C) (Oral)  Resp 20  Ht 6' (1.829 m)  Wt 135 lb (61.236 kg)  BMI 18.31 kg/m2  SpO2 98% Physical Exam  Nursing note and vitals reviewed. Constitutional: He appears well-developed and well-nourished.  Awake, alert, nontoxic appearance.  HENT:  Head: Normocephalic and atraumatic.  Eyes: EOM are normal. Pupils are equal, round, and reactive to light.  Neck: Neck supple.  Cardiovascular: Normal rate and intact distal pulses.   Pulmonary/Chest: Effort normal and breath sounds normal. He exhibits no tenderness.  Abdominal: Soft. There is no tenderness. There is no rebound.  Musculoskeletal: He exhibits no tenderness.  Baseline ROM, no obvious new focal weakness.No spinal pain with percussion. Tenderness to palpation of the left paraspinal muscles in the lumbar region. Negative straight leg raise.   Neurological:  Mental status and motor strength appears baseline for patient and situation.  Skin: No rash noted.  Psychiatric: He has a normal mood and affect.    ED Course  Procedures (including critical care time) Labs Reviewed - No data to display  1. Sciatica, left     MDM  Patient with acute on chronic back pain. Left sciatica pain. Given vicodin and ibuprofen. Pt stable in ED with no significant deterioration in condition.The patient appears reasonably screened and/or stabilized for discharge and I doubt any other medical condition or other [facility] requiring further screening, evaluation, or treatment in the ED at this time prior to discharge.  MDM Reviewed: nursing note and  vitals     Nicoletta Dress. Colon Branch, MD 06/04/13 1478

## 2013-06-22 ENCOUNTER — Emergency Department (HOSPITAL_COMMUNITY)
Admission: EM | Admit: 2013-06-22 | Discharge: 2013-06-22 | Disposition: A | Discharge status: Home / Self Care | Payer: Self-pay | Attending: Emergency Medicine | Admitting: Emergency Medicine

## 2013-06-22 ENCOUNTER — Emergency Department (HOSPITAL_COMMUNITY): Payer: Self-pay

## 2013-06-22 ENCOUNTER — Encounter (HOSPITAL_COMMUNITY): Payer: Self-pay | Admitting: *Deleted

## 2013-06-22 DIAGNOSIS — S39012A Strain of muscle, fascia and tendon of lower back, initial encounter: Secondary | ICD-10-CM

## 2013-06-22 DIAGNOSIS — X500XXA Overexertion from strenuous movement or load, initial encounter: Secondary | ICD-10-CM | POA: Insufficient documentation

## 2013-06-22 DIAGNOSIS — IMO0002 Reserved for concepts with insufficient information to code with codable children: Secondary | ICD-10-CM | POA: Insufficient documentation

## 2013-06-22 DIAGNOSIS — S338XXA Sprain of other parts of lumbar spine and pelvis, initial encounter: Secondary | ICD-10-CM | POA: Insufficient documentation

## 2013-06-22 DIAGNOSIS — Z8701 Personal history of pneumonia (recurrent): Secondary | ICD-10-CM | POA: Insufficient documentation

## 2013-06-22 DIAGNOSIS — F17200 Nicotine dependence, unspecified, uncomplicated: Secondary | ICD-10-CM | POA: Insufficient documentation

## 2013-06-22 DIAGNOSIS — J4489 Other specified chronic obstructive pulmonary disease: Secondary | ICD-10-CM | POA: Insufficient documentation

## 2013-06-22 DIAGNOSIS — Y9301 Activity, walking, marching and hiking: Secondary | ICD-10-CM | POA: Insufficient documentation

## 2013-06-22 DIAGNOSIS — Y9389 Activity, other specified: Secondary | ICD-10-CM | POA: Insufficient documentation

## 2013-06-22 DIAGNOSIS — Y9289 Other specified places as the place of occurrence of the external cause: Secondary | ICD-10-CM | POA: Insufficient documentation

## 2013-06-22 DIAGNOSIS — S335XXA Sprain of ligaments of lumbar spine, initial encounter: Secondary | ICD-10-CM | POA: Insufficient documentation

## 2013-06-22 DIAGNOSIS — J449 Chronic obstructive pulmonary disease, unspecified: Secondary | ICD-10-CM | POA: Insufficient documentation

## 2013-06-22 MED ORDER — CYCLOBENZAPRINE HCL 10 MG PO TABS: 10.0000 mg | ORAL_TABLET | Freq: Two times a day (BID) | ORAL | Status: DC | PRN

## 2013-06-22 MED ORDER — PREDNISONE 10 MG PO TABS: 20.0000 mg | ORAL_TABLET | Freq: Every day | ORAL | Status: DC

## 2013-06-22 MED ORDER — PREDNISONE 20 MG PO TABS
40.0000 mg | ORAL_TABLET | Freq: Once | ORAL | Status: AC
  Administered 2013-06-22: 40 mg via ORAL
  Filled 2013-06-22: qty 2

## 2013-06-22 MED ORDER — OXYCODONE-ACETAMINOPHEN 5-325 MG PO TABS
1.0000 | ORAL_TABLET | Freq: Once | ORAL | Status: AC
  Administered 2013-06-22: 1 via ORAL
  Filled 2013-06-22: qty 1

## 2013-06-22 MED ORDER — HYDROCODONE-ACETAMINOPHEN 5-325 MG PO TABS: 1.0000 | ORAL_TABLET | ORAL | Status: DC | PRN

## 2013-06-22 NOTE — ED Notes (Signed)
Pain rt lower back, hip , down to knee, "burning pain",  45yo grandson grabbed his rt leg and sat down. On Friday, pain since then.

## 2013-06-22 NOTE — ED Provider Notes (Signed)
CSN: [id number]     Arrival date & time 06/22/13  1140 History     First MD Initiated Contact with Patient 06/22/13 1152     Chief Complaint  Patient presents with  . Hip Pain   (Consider location/radiation/quality/duration/timing/severity/associated sxs/prior Treatment) The history is provided by the patient.   William Steele is a 47 y.o. male who presents to the ED with right side back and hip pain. He states that he was walking and his grandson who weighs about 50 pounds jumped up and grabbed his right thigh and pulled up with all his weight. Since that time he has had pain that is in the right side of his back and radiates to the right hip and leg. He has a history of chronic low back pain but it is usually on the left side. He denies any other injuries or problems today.  Past Medical History  Diagnosis Date  . Pneumonia   . Chronic back pain   . COPD (chronic obstructive pulmonary disease)    History reviewed. No pertinent past surgical history. No family history on file. History  Substance Use Topics  . Smoking status: Current Every Day Smoker -- 1.00 packs/day    Types: Cigarettes  . Smokeless tobacco: Not on file  . Alcohol Use: No    Review of Systems  Constitutional: Negative for fever and chills.  HENT: Negative for neck pain.   Respiratory: Negative for shortness of breath.   Gastrointestinal: Negative for nausea, vomiting and abdominal pain.  Genitourinary: Negative for flank pain.  Musculoskeletal: Positive for back pain.  Skin: Negative for rash.  Neurological: Negative for headaches.  Psychiatric/Behavioral: The patient is not nervous/anxious.     Allergies  Review of patient's allergies indicates no known allergies.  Home Medications   Current Outpatient Rx  Name  Route  Sig  Dispense  Refill  . HYDROcodone-acetaminophen (NORCO/VICODIN) 5-325 MG per tablet   Oral   Take 1 tablet by mouth every 4 (four) hours as needed for pain.   15 tablet   0   . HYDROcodone-acetaminophen (NORCO/VICODIN) 5-325 MG per tablet   Oral   Take 1 tablet by mouth every 4 (four) hours as needed for pain.   15 tablet   0   . ibuprofen (ADVIL,MOTRIN) 800 MG tablet   Oral   Take 1 tablet (800 mg total) by mouth 3 (three) times daily.   21 tablet   0   . predniSONE (DELTASONE) 20 MG tablet   Oral   Take 20 mg by mouth 3 (three) times daily.          BP 122/79  Pulse 74  Temp(Src) 97.7 F (36.5 C) (Oral)  Resp 20  Ht 6" (0.152 m)  Wt 135 lb (61.236 kg)  BMI 2,650.45 kg/m2  SpO2 100% Physical Exam  Nursing note and vitals reviewed. Constitutional: He is oriented to person, place, and time. He appears well-developed and well-nourished. No distress.  HENT:  Head: Normocephalic.  Eyes: EOM are normal.  Neck: Normal range of motion. Neck supple.  Cardiovascular: Normal rate and regular rhythm.   Pulmonary/Chest: Effort normal and breath sounds normal.  Abdominal: Soft. There is no tenderness.  Musculoskeletal:       Lumbar back: He exhibits decreased range of motion, tenderness, pain and spasm. He exhibits no deformity and normal pulse.       Back:  Pedal pulses equal bilateral, adequate circulation, good touch sensation.   Neurological: He is alert  and oriented to person, place, and time. He has normal strength and normal reflexes. No cranial nerve deficit or sensory deficit. Gait normal.  Skin: Skin is warm and dry.  Psychiatric: He has a normal mood and affect. His behavior is normal.    ED Course  Dg Lumbar Spine Complete  06/22/2013   *RADIOLOGY REPORT*  Clinical Data: Low back pain.  Right posterior hip pain.  LUMBAR SPINE - COMPLETE 4+ VIEW  Comparison: 03/29/2013.  Findings: The alignment is unchanged compared to prior exam with mild dextroconvex curve.  The apex is at L4.  The vertebral body height is preserved.  The mild degenerative disc disease at L3-L4 and L4-L5.  Lumbosacral junction appears normal. No fracture.  IMPRESSION:  No interval change.  Mild lumbar spondylosis.   Original Report Authenticated By: Andreas Newport, M.D.    Procedures  MDM  47 y.o. male with lumbosacral strain. Will treat pain and spasm. He is to follow up with Dr. Romeo Apple or return as needed.  I have reviewed this patient's vital signs, nurses notes, appropriate imaging and discussed results with the patient and plan of care. Patient voices understanding.   Medication List    STOP taking these medications       ibuprofen 800 MG tablet  Commonly known as:  ADVIL,MOTRIN      TAKE these medications       cyclobenzaprine 10 MG tablet  Commonly known as:  FLEXERIL  Take 1 tablet (10 mg total) by mouth 2 (two) times daily as needed for muscle spasms.     HYDROcodone-acetaminophen 5-325 MG per tablet  Commonly known as:  NORCO/VICODIN  Take 1 tablet by mouth every 4 (four) hours as needed.     predniSONE 10 MG tablet  Commonly known as:  DELTASONE  Take 2 tablets (20 mg total) by mouth daily.          [name], [place] 06/22/13 1358

## 2013-06-22 NOTE — ED Provider Notes (Signed)
Medical screening examination/treatment/procedure(s) were performed by non-physician practitioner and as supervising physician I was immediately available for consultation/collaboration.  Donnetta Hutching, MD 06/22/13 [phone]

## 2013-06-22 NOTE — ED Notes (Signed)
C/o pain to right hip after grandson jumped on his back while he was walking.

## 2013-07-02 ENCOUNTER — Emergency Department (HOSPITAL_COMMUNITY)
Admission: EM | Admit: 2013-07-02 | Discharge: 2013-07-02 | Disposition: A | Discharge status: Home / Self Care | Payer: Self-pay | Attending: Emergency Medicine | Admitting: Emergency Medicine

## 2013-07-02 ENCOUNTER — Encounter (HOSPITAL_COMMUNITY): Payer: Self-pay | Admitting: Emergency Medicine

## 2013-07-02 DIAGNOSIS — M545 Low back pain, unspecified: Secondary | ICD-10-CM | POA: Insufficient documentation

## 2013-07-02 DIAGNOSIS — R300 Dysuria: Secondary | ICD-10-CM | POA: Insufficient documentation

## 2013-07-02 DIAGNOSIS — G8929 Other chronic pain: Secondary | ICD-10-CM | POA: Insufficient documentation

## 2013-07-02 DIAGNOSIS — IMO0002 Reserved for concepts with insufficient information to code with codable children: Secondary | ICD-10-CM | POA: Insufficient documentation

## 2013-07-02 DIAGNOSIS — Z8701 Personal history of pneumonia (recurrent): Secondary | ICD-10-CM | POA: Insufficient documentation

## 2013-07-02 DIAGNOSIS — J4489 Other specified chronic obstructive pulmonary disease: Secondary | ICD-10-CM | POA: Insufficient documentation

## 2013-07-02 DIAGNOSIS — F17200 Nicotine dependence, unspecified, uncomplicated: Secondary | ICD-10-CM | POA: Insufficient documentation

## 2013-07-02 DIAGNOSIS — J449 Chronic obstructive pulmonary disease, unspecified: Secondary | ICD-10-CM | POA: Insufficient documentation

## 2013-07-02 LAB — URINALYSIS, ROUTINE W REFLEX MICROSCOPIC
Bilirubin Urine: NEGATIVE
Hgb urine dipstick: NEGATIVE
Protein, ur: NEGATIVE mg/dL
Urobilinogen, UA: 0.2 mg/dL (ref 0.0–1.0)

## 2013-07-02 MED ORDER — DEXAMETHASONE 6 MG PO TABS: ORAL_TABLET | ORAL | Status: DC

## 2013-07-02 MED ORDER — BACLOFEN 10 MG PO TABS: 10.0000 mg | ORAL_TABLET | Freq: Three times a day (TID) | ORAL | Status: AC

## 2013-07-02 MED ORDER — DICLOFENAC SODIUM 75 MG PO TBEC: 75.0000 mg | DELAYED_RELEASE_TABLET | Freq: Two times a day (BID) | ORAL | Status: DC

## 2013-07-02 NOTE — ED Provider Notes (Signed)
CSN: [id number]     Arrival date & time 07/02/13  1354 History     First MD Initiated Contact with Patient 07/02/13 1429     Chief Complaint  Patient presents with  . Back Pain   (Consider location/radiation/quality/duration/timing/severity/associated sxs/prior Treatment) Patient is a 47 y.o. male presenting with back pain. The history is provided by the patient.  Back Pain Location:  Lumbar spine Quality:  Aching Radiates to:  L thigh Pain severity:  Severe Pain is:  Same all the time Timing:  Intermittent Progression:  Worsening Chronicity:  Chronic Relieved by:  Nothing Worsened by:  Movement and standing Associated symptoms: dysuria   Associated symptoms: no abdominal pain, no bladder incontinence, no bowel incontinence, no chest pain and no perianal numbness     Past Medical History  Diagnosis Date  . Pneumonia   . Chronic back pain   . COPD (chronic obstructive pulmonary disease)    History reviewed. No pertinent past surgical history. History reviewed. No pertinent family history. History  Substance Use Topics  . Smoking status: Current Every Day Smoker -- 1.00 packs/day    Types: Cigarettes  . Smokeless tobacco: Not on file  . Alcohol Use: No    Review of Systems  Constitutional: Negative for activity change.       All ROS Neg except as noted in HPI  HENT: Negative for nosebleeds and neck pain.   Eyes: Negative for photophobia and discharge.  Respiratory: Negative for cough, shortness of breath and wheezing.   Cardiovascular: Negative for chest pain and palpitations.  Gastrointestinal: Negative for abdominal pain, blood in stool and bowel incontinence.  Genitourinary: Positive for dysuria. Negative for bladder incontinence, frequency and hematuria.  Musculoskeletal: Positive for back pain. Negative for arthralgias.  Skin: Negative.   Neurological: Negative for dizziness, seizures and speech difficulty.  Psychiatric/Behavioral: Negative for hallucinations  and confusion.    Allergies  Review of patient's allergies indicates no known allergies.  Home Medications   Current Outpatient Rx  Name  Route  Sig  Dispense  Refill  . cyclobenzaprine (FLEXERIL) 10 MG tablet   Oral   Take 1 tablet (10 mg total) by mouth 2 (two) times daily as needed for muscle spasms.   20 tablet   0   . HYDROcodone-acetaminophen (NORCO/VICODIN) 5-325 MG per tablet   Oral   Take 1 tablet by mouth every 4 (four) hours as needed.   15 tablet   0   . predniSONE (DELTASONE) 10 MG tablet   Oral   Take 2 tablets (20 mg total) by mouth daily.   15 tablet   0    BP 138/84  Pulse 93  Temp(Src) 98.2 F (36.8 C) (Oral)  Resp 21  SpO2 99% Physical Exam  Nursing note and vitals reviewed. Constitutional: He is oriented to person, place, and time. He appears well-developed and well-nourished.  Non-toxic appearance.  HENT:  Head: Normocephalic.  Right Ear: Tympanic membrane and external ear normal.  Left Ear: Tympanic membrane and external ear normal.  Eyes: EOM and lids are normal. Pupils are equal, round, and reactive to light.  Neck: Normal range of motion. Neck supple. Carotid bruit is not present.  Cardiovascular: Normal rate, regular rhythm, normal heart sounds, intact distal pulses and normal pulses.   Pulmonary/Chest: Breath sounds normal. No respiratory distress.  Abdominal: Soft. Bowel sounds are normal. There is no tenderness. There is no guarding.  Musculoskeletal:       Lumbar back: He exhibits decreased range  of motion, tenderness and pain.  No palpable step off. No hot areas.  Lymphadenopathy:       Head (right side): No submandibular adenopathy present.       Head (left side): No submandibular adenopathy present.    He has no cervical adenopathy.  Neurological: He is alert and oriented to person, place, and time. He has normal strength. No cranial nerve deficit or sensory deficit.  Skin: Skin is warm and dry.  Psychiatric: He has a normal  mood and affect. His speech is normal.    ED Course   Procedures (including critical care time)  Labs Reviewed  URINALYSIS, ROUTINE W REFLEX MICROSCOPIC - Abnormal; Notable for the following:    Specific Gravity, Urine <1.005 (*)    All other components within normal limits   No results found. No diagnosis found.  MDM  *I have reviewed nursing notes, vital signs, and all appropriate lab and imaging results for this patient.** Pt states he has had discomfort with urination and a scant amount of urine since a yesterday. Urinating in the emergency department without problem. No fever , no blood in urine. UA is negative for acute changes.  Exam is negative for gross neuro deficit. Urology and orthopedic consultations offered. Pt states he does not have insurance. Resources packet including [facility] information given to the patient at his request. Rx for diclofenac, decadron, and baclofen given to the patient. Pt encouraged to see MD at the clinic for recheck as soon as possible, or return to the ED.  Kathie Dike, PA-C 07/02/13 1643

## 2013-07-02 NOTE — ED Notes (Addendum)
Here last week for lower back pain to right side only x 1 week. Seen here for it last week but meds not working. States started having pain with urination and scant amounts started yesterday. Nad at this time. No genital swelling per pt. Pain is worse with moving.

## 2013-07-02 NOTE — Progress Notes (Signed)
ED/CM noted patient did not have health insurance and/or PCP listed in the computer.  Patient was given the [place] resource handout with information on the clinics, food pantries, and the handout for new health insurance sign-up.  Patient expressed appreciation for this. 

## 2013-07-03 NOTE — ED Provider Notes (Signed)
Medical screening examination/treatment/procedure(s) were performed by non-physician practitioner and as supervising physician I was immediately available for consultation/collaboration.    Celene Kras, MD 07/03/13 [phone]

## 2013-10-16 ENCOUNTER — Emergency Department (HOSPITAL_COMMUNITY): Payer: Self-pay

## 2013-10-16 ENCOUNTER — Encounter (HOSPITAL_COMMUNITY): Payer: Self-pay | Admitting: Emergency Medicine

## 2013-10-16 ENCOUNTER — Emergency Department (HOSPITAL_COMMUNITY)
Admission: EM | Admit: 2013-10-16 | Discharge: 2013-10-16 | Disposition: A | Discharge status: Home / Self Care | Payer: Self-pay | Attending: Emergency Medicine | Admitting: Emergency Medicine

## 2013-10-16 DIAGNOSIS — J4489 Other specified chronic obstructive pulmonary disease: Secondary | ICD-10-CM | POA: Insufficient documentation

## 2013-10-16 DIAGNOSIS — Z8739 Personal history of other diseases of the musculoskeletal system and connective tissue: Secondary | ICD-10-CM | POA: Insufficient documentation

## 2013-10-16 DIAGNOSIS — R51 Headache: Secondary | ICD-10-CM | POA: Insufficient documentation

## 2013-10-16 DIAGNOSIS — F17200 Nicotine dependence, unspecified, uncomplicated: Secondary | ICD-10-CM | POA: Insufficient documentation

## 2013-10-16 DIAGNOSIS — J449 Chronic obstructive pulmonary disease, unspecified: Secondary | ICD-10-CM | POA: Insufficient documentation

## 2013-10-16 DIAGNOSIS — R112 Nausea with vomiting, unspecified: Secondary | ICD-10-CM | POA: Insufficient documentation

## 2013-10-16 DIAGNOSIS — M542 Cervicalgia: Secondary | ICD-10-CM | POA: Insufficient documentation

## 2013-10-16 DIAGNOSIS — G8929 Other chronic pain: Secondary | ICD-10-CM | POA: Insufficient documentation

## 2013-10-16 DIAGNOSIS — H53149 Visual discomfort, unspecified: Secondary | ICD-10-CM | POA: Insufficient documentation

## 2013-10-16 DIAGNOSIS — Z8701 Personal history of pneumonia (recurrent): Secondary | ICD-10-CM | POA: Insufficient documentation

## 2013-10-16 HISTORY — DX: Sciatica, unspecified side: M54.30

## 2013-10-16 HISTORY — DX: Other cervical disc degeneration, unspecified cervical region: M50.30

## 2013-10-16 LAB — BASIC METABOLIC PANEL
BUN: 15 mg/dL (ref 6–23)
CO2: 28 mEq/L (ref 19–32)
Chloride: 102 mEq/L (ref 96–112)
Creatinine, Ser: 1.14 mg/dL (ref 0.50–1.35)
GFR calc Af Amer: 88 mL/min — ABNORMAL LOW (ref >90)
Glucose, Bld: 95 mg/dL (ref 70–99)
Potassium: 4.4 mEq/L (ref 3.5–5.1)

## 2013-10-16 LAB — CBC WITH DIFFERENTIAL/PLATELET
Basophils Relative: 1 % (ref 0–1)
HCT: 46.4 % (ref 39.0–52.0)
Hemoglobin: 15.8 g/dL (ref 13.0–17.0)
Lymphocytes Relative: 40 % (ref 12–46)
Lymphs Abs: 2.3 10*3/uL (ref 0.7–4.0)
Monocytes Absolute: 0.5 10*3/uL (ref 0.1–1.0)
Monocytes Relative: 8 % (ref 3–12)
Neutro Abs: 2.9 10*3/uL (ref 1.7–7.7)
Neutrophils Relative %: 50 % (ref 43–77)
RBC: 5.46 MIL/uL (ref 4.22–5.81)
WBC: 5.8 10*3/uL (ref 4.0–10.5)

## 2013-10-16 LAB — APTT: aPTT: 40 seconds — ABNORMAL HIGH (ref 24–37)

## 2013-10-16 MED ORDER — DIPHENHYDRAMINE HCL 50 MG/ML IJ SOLN
12.5000 mg | Freq: Once | INTRAMUSCULAR | Status: AC
  Administered 2013-10-16: 12.5 mg via INTRAVENOUS
  Filled 2013-10-16: qty 1

## 2013-10-16 MED ORDER — BUTALBITAL-APAP-CAFFEINE 50-325-40 MG PO TABS: 1.0000 | ORAL_TABLET | Freq: Four times a day (QID) | ORAL | Status: DC | PRN

## 2013-10-16 MED ORDER — PROCHLORPERAZINE EDISYLATE 5 MG/ML IJ SOLN
10.0000 mg | Freq: Once | INTRAMUSCULAR | Status: AC
  Administered 2013-10-16: 10 mg via INTRAVENOUS
  Filled 2013-10-16: qty 2

## 2013-10-16 MED ORDER — MORPHINE SULFATE 4 MG/ML IJ SOLN
4.0000 mg | Freq: Once | INTRAMUSCULAR | Status: DC
  Filled 2013-10-16: qty 1

## 2013-10-16 NOTE — ED Notes (Signed)
Pt c/o headache Wednesday, started vomiting Thursday and having neck pain.  Denies fever.

## 2013-10-16 NOTE — ED Provider Notes (Signed)
CSN: [id number]     Arrival date & time 10/16/13  1604 History  This chart was scribed for Celene Kras, MD by Ardelia Mems, ED Scribe. This patient was seen in room APA18/APA18 and the patient's care was started at [facility] PM.   Chief Complaint  Patient presents with  . Headache    The history is provided by the patient. No language interpreter was used.    HPI Comments: NAHZIR POHLE is a 47 y.o. male with a history of COPD and chronic back pain who presents to the Emergency Department complaining of an intermittent, moderate generalized headache, that is worst behind his bilateral eyes, onset 2 days ago. He states that he does not have frequent headaches, and that he has had "about 3 headaches in my entire life". He reports associated nausea with multiple episodes of emesis over the past 2 days. He also reports associated neck pain over the past 2 days. He states that his headache is worsened with light and with sound. He denies any injuries to have onset his pain. He denies any recent sick contacts. He denies having a family history of migraines or of aneurysms. He denies fever, sore throat, cough or any other recent symptoms. Pt is a current every day smoker of 1 pack/day.    Past Medical History  Diagnosis Date  . Pneumonia   . Chronic back pain   . COPD (chronic obstructive pulmonary disease)   . DDD (degenerative disc disease), cervical   . Sciatic pain    History reviewed. No pertinent past surgical history. No family history on file. History  Substance Use Topics  . Smoking status: Current Every Day Smoker -- 1.00 packs/day    Types: Cigarettes  . Smokeless tobacco: Not on file  . Alcohol Use: No    Review of Systems  Constitutional: Negative for fever.  HENT: Negative for sore throat.   Eyes: Positive for photophobia.  Respiratory: Negative for cough.   Gastrointestinal: Positive for nausea and vomiting.  Musculoskeletal: Positive for neck pain.  Neurological: Positive  for headaches.  All other systems reviewed and are negative.   Allergies  Review of patient's allergies indicates no known allergies.  Home Medications   Current Outpatient Rx  Name  Route  Sig  Dispense  Refill  . ibuprofen (ADVIL,MOTRIN) 200 MG tablet   Oral   Take 400 mg by mouth 4 (four) times daily as needed for headache.         . butalbital-acetaminophen-caffeine (FIORICET) 50-325-40 MG per tablet   Oral   Take 1-2 tablets by mouth every 6 (six) hours as needed for headache (max 6 tabs per day).   20 tablet   0    Triage Vitals: BP 127/81  Pulse 74  Temp(Src) 98.4 F (36.9 C) (Oral)  Resp 18  Ht 6' (1.829 m)  Wt 135 lb (61.236 kg)  BMI 18.31 kg/m2  SpO2 96%  Physical Exam  Nursing note and vitals reviewed. Constitutional: He appears well-developed and well-nourished. No distress.  HENT:  Head: Normocephalic and atraumatic.  Right Ear: External ear normal.  Left Ear: External ear normal.  Eyes: Conjunctivae are normal. Right eye exhibits no discharge. Left eye exhibits no discharge. No scleral icterus.  Photophobia.  Neck: Neck supple. No tracheal deviation present.  Mild pain with ROM of the neck. No discrete meningismus.   Cardiovascular: Normal rate, regular rhythm and intact distal pulses.   Pulmonary/Chest: Effort normal and breath sounds normal. No stridor.  No respiratory distress. He has no wheezes. He has no rales.  Abdominal: Soft. Bowel sounds are normal. He exhibits no distension. There is no tenderness. There is no rebound and no guarding.  Musculoskeletal: He exhibits no edema and no tenderness.  Neurological: He is alert. He has normal strength. No sensory deficit. Cranial nerve deficit:  no gross defecits noted. He exhibits normal muscle tone. He displays no seizure activity. Coordination normal.  Skin: Skin is warm and dry. No rash noted.  Psychiatric: He has a normal mood and affect.    ED Course  Procedures (including critical care  time)  DIAGNOSTIC STUDIES: Oxygen Saturation is 96% on RA, normal by my interpretation.    COORDINATION OF CARE: 6:16 PM- Discussed plan for to receive medications in the ED. Will obtain diagnostic lab work and a head CT. Pt advised of plan for treatment and pt agrees.  Medications  prochlorperazine (COMPAZINE) injection 10 mg (10 mg Intravenous Given 10/16/13 1846)  diphenhydrAMINE (BENADRYL) injection 12.5 mg (12.5 mg Intravenous Given 10/16/13 1846)   Labs Review` Labs Reviewed  BASIC METABOLIC PANEL - Abnormal; Notable for the following:    GFR calc non Af Amer 76 (*)    GFR calc Af Amer 88 (*)    All other components within normal limits  APTT - Abnormal; Notable for the following:    aPTT 40 (*)    All other components within normal limits  CBC WITH DIFFERENTIAL  PROTIME-INR   Imaging Review Ct Head Wo Contrast  10/16/2013   CLINICAL DATA:  Headache for the past 2 days. Nausea and vomiting. Neck pain. Photophobia. Smoker.  EXAM: CT HEAD WITHOUT CONTRAST  TECHNIQUE: Contiguous axial images were obtained from the base of the skull through the vertex without intravenous contrast.  COMPARISON:  01/01/2011.  FINDINGS: Normal appearing cerebral hemispheres and posterior fossa structures. Normal size and position of the ventricles. No intracranial hemorrhage, mass lesion or CT evidence of acute infarction. Unremarkable bones and included paranasal sinuses.  IMPRESSION: Normal examination.   Electronically Signed   By: Gordan Payment M.D.   On: 10/16/2013 19:19    EKG Interpretation   None       MDM   1. Headache    Patient's symptoms improved after treatment with a migraine cocktail. He did not have a thunderclap onset. I doubt subarachnoid hemorrhage. Patient is afebrile. He does not have a leukocytosis. He has not had any other symptoms to suggest infection. I doubt meningitis. At this point, do not feel that lumbar puncture is indicated. The patient is feeling much better and is  ready to go home. I discussed warning signs and precautions.  I personally performed the services described in this documentation, which was scribed in my presence.  The recorded information has been reviewed and is accurate.     Celene Kras, MD 10/16/13 [phone]

## 2014-02-22 ENCOUNTER — Encounter (HOSPITAL_COMMUNITY): Payer: Self-pay | Admitting: Emergency Medicine

## 2014-02-22 ENCOUNTER — Emergency Department (HOSPITAL_COMMUNITY)
Admission: EM | Admit: 2014-02-22 | Discharge: 2014-02-22 | Disposition: A | Discharge status: Home / Self Care | Payer: Self-pay | Attending: Emergency Medicine | Admitting: Emergency Medicine

## 2014-02-22 DIAGNOSIS — M5430 Sciatica, unspecified side: Secondary | ICD-10-CM | POA: Insufficient documentation

## 2014-02-22 DIAGNOSIS — J029 Acute pharyngitis, unspecified: Secondary | ICD-10-CM | POA: Insufficient documentation

## 2014-02-22 DIAGNOSIS — Z8739 Personal history of other diseases of the musculoskeletal system and connective tissue: Secondary | ICD-10-CM | POA: Insufficient documentation

## 2014-02-22 DIAGNOSIS — M545 Low back pain, unspecified: Secondary | ICD-10-CM | POA: Insufficient documentation

## 2014-02-22 DIAGNOSIS — J449 Chronic obstructive pulmonary disease, unspecified: Secondary | ICD-10-CM | POA: Insufficient documentation

## 2014-02-22 DIAGNOSIS — M25559 Pain in unspecified hip: Secondary | ICD-10-CM | POA: Insufficient documentation

## 2014-02-22 DIAGNOSIS — H6691 Otitis media, unspecified, right ear: Secondary | ICD-10-CM

## 2014-02-22 DIAGNOSIS — K644 Residual hemorrhoidal skin tags: Secondary | ICD-10-CM | POA: Insufficient documentation

## 2014-02-22 DIAGNOSIS — Z8701 Personal history of pneumonia (recurrent): Secondary | ICD-10-CM | POA: Insufficient documentation

## 2014-02-22 DIAGNOSIS — F17200 Nicotine dependence, unspecified, uncomplicated: Secondary | ICD-10-CM | POA: Insufficient documentation

## 2014-02-22 DIAGNOSIS — H6690 Otitis media, unspecified, unspecified ear: Secondary | ICD-10-CM | POA: Insufficient documentation

## 2014-02-22 DIAGNOSIS — J4489 Other specified chronic obstructive pulmonary disease: Secondary | ICD-10-CM | POA: Insufficient documentation

## 2014-02-22 DIAGNOSIS — G8929 Other chronic pain: Secondary | ICD-10-CM | POA: Insufficient documentation

## 2014-02-22 DIAGNOSIS — M5432 Sciatica, left side: Secondary | ICD-10-CM

## 2014-02-22 LAB — POC OCCULT BLOOD, ED: Fecal Occult Bld: NEGATIVE

## 2014-02-22 MED ORDER — CYCLOBENZAPRINE HCL 10 MG PO TABS: 10.0000 mg | ORAL_TABLET | Freq: Three times a day (TID) | ORAL | Status: DC | PRN

## 2014-02-22 MED ORDER — CYCLOBENZAPRINE HCL 10 MG PO TABS
10.0000 mg | ORAL_TABLET | Freq: Once | ORAL | Status: AC
  Administered 2014-02-22: 10 mg via ORAL
  Filled 2014-02-22: qty 1

## 2014-02-22 MED ORDER — PREDNISONE 10 MG PO TABS
ORAL_TABLET | ORAL | Status: DC
Start: 2014-02-22 — End: 2015-02-14

## 2014-02-22 MED ORDER — HYDROCODONE-ACETAMINOPHEN 5-325 MG PO TABS
1.0000 | ORAL_TABLET | Freq: Once | ORAL | Status: AC
  Administered 2014-02-22: 1 via ORAL
  Filled 2014-02-22: qty 1

## 2014-02-22 MED ORDER — HYDROCODONE-ACETAMINOPHEN 5-325 MG PO TABS: ORAL_TABLET | ORAL | Status: DC

## 2014-02-22 MED ORDER — AMOXICILLIN 250 MG PO CAPS
500.0000 mg | ORAL_CAPSULE | Freq: Once | ORAL | Status: AC
  Administered 2014-02-22: 500 mg via ORAL
  Filled 2014-02-22: qty 2

## 2014-02-22 MED ORDER — AMOXICILLIN 500 MG PO CAPS: 500.0000 mg | ORAL_CAPSULE | Freq: Three times a day (TID) | ORAL | Status: DC

## 2014-02-22 MED ORDER — HYDROCORTISONE 2.5 % RE CREA: TOPICAL_CREAM | RECTAL | Status: DC

## 2014-02-22 NOTE — ED Notes (Signed)
Sorethroat,  Left leg pain and states he is passing blood.  States this has been going on for a while.

## 2014-02-22 NOTE — Discharge Instructions (Signed)
Fiber Content in Foods Drinking plenty of fluids and consuming foods high in fiber can help with constipation. See the list below for the fiber content of some common foods. Starches and Grains / Dietary Fiber (g)  Cheerios, 1 cup / 3 g  Kellogg's Corn Flakes, 1 cup / 0.7 g  Rice Krispies, 1  cup / 0.3 g  Quaker Oat Life Cereal,  cup / 2.1 g  Oatmeal, instant (cooked),  cup / 2 g  Kellogg's Frosted Mini Wheats, 1 cup / 5.1 g  Rice, brown, long-grain (cooked), 1 cup / 3.5 g  Rice, white, long-grain (cooked), 1 cup / 0.6 g  Macaroni, cooked, enriched, 1 cup / 2.5 g Legumes / Dietary Fiber (g)  Beans, baked, canned, plain or vegetarian,  cup / 5.2 g  Beans, kidney, canned,  cup / 6.8 g  Beans, pinto, dried (cooked),  cup / 7.7 g  Beans, pinto, canned,  cup / 5.5 g Breads and Crackers / Dietary Fiber (g)  Graham crackers, plain or honey, 2 squares / 0.7 g  Saltine crackers, 3 squares / 0.3 g  Pretzels, plain, salted, 10 pieces / 1.8 g  Bread, whole-wheat, 1 slice / 1.9 g  Bread, white, 1 slice / 0.7 g  Bread, raisin, 1 slice / 1.2 g  Bagel, plain, 3 oz / 2 g  Tortilla, flour, 1 oz / 0.9 g  Tortilla, corn, 1 small / 1.5 g  Bun, hamburger or hotdog, 1 small / 0.9 g Fruits / Dietary Fiber (g)  Apple, raw with skin, 1 medium / 4.4 g  Applesauce, sweetened,  cup / 1.5 g  Banana,  medium / 1.5 g  Grapes, 10 grapes / 0.4 g  Orange, 1 small / 2.3 g  Raisin, 1.5 oz / 1.6 g  Melon, 1 cup / 1.4 g Vegetables / Dietary Fiber (g)  Green beans, canned,  cup / 1.3 g  Carrots (cooked),  cup / 2.3 g  Broccoli (cooked),  cup / 2.8 g  Peas, frozen (cooked),  cup / 4.4 g  Potatoes, mashed,  cup / 1.6 g  Lettuce, 1 cup / 0.5 g  Corn, canned,  cup / 1.6 g  Tomato,  cup / 1.1 g Document Released: 03/17/2007 Document Revised: 01/21/2012 Document Reviewed: 05/12/2007 [employer] Patient Information 2014 [employer], [place].  Hemorrhoids Hemorrhoids  are puffy (swollen) veins around the rectum or anus. Hemorrhoids can cause pain, itching, bleeding, or irritation. HOME CARE  Eat foods with fiber, such as whole grains, beans, nuts, fruits, and vegetables. Ask your doctor about taking products with added fiber in them (fibersupplements).  Drink enough fluid to keep your pee (urine) clear or pale yellow.  Exercise often.  Go to the bathroom when you have the urge to poop. Do not wait.  Avoid straining to poop (bowel movement).  Keep the butt area dry and clean. Use wet toilet paper or moist paper towels.  Medicated creams and medicine inserted into the anus (anal suppository) may be used or applied as told.  Only take medicine as told by your doctor.  Take a warm water bath (sitz bath) for 15 20 minutes to ease pain. Do this 3 4 times a day.  Place ice packs on the area if it is tender or puffy. Use the ice packs between the warm water baths.  Put ice in a plastic bag.  Place a towel between your skin and the bag.  Leave the ice on for 15-20 minutes, 03-04  times a day.  Do not use a donut-shaped pillow or sit on the toilet for a long time. GET HELP RIGHT AWAY IF:   You have more pain that is not controlled by treatment or medicine.  You have bleeding that will not stop.  You have trouble or are unable to poop (bowel movement).  You have pain or puffiness outside the area of the hemorrhoids. MAKE SURE YOU:   Understand these instructions.  Will watch your condition.  Will get help right away if you are not doing well or get worse. Document Released: 08/07/2008 Document Revised: 10/15/2012 Document Reviewed: 09/09/2012 [facility] Patient Information 2014 [place], [place].  Otitis Media, Adult Otitis media is redness, soreness, and swelling (inflammation) of the middle ear. Otitis media may be caused by allergies or, most commonly, by infection. Often it occurs as a complication of the common cold. SIGNS AND  SYMPTOMS Symptoms of otitis media may include:  Earache.  Fever.  Ringing in your ear.  Headache.  Leakage of fluid from the ear. DIAGNOSIS To diagnose otitis media, your health care provider will examine your ear with an otoscope. This is an instrument that allows your health care provider to see into your ear in order to examine your eardrum. Your health care provider also will ask you questions about your symptoms. TREATMENT  Typically, otitis media resolves on its own within 3 5 days. Your health care provider may prescribe medicine to ease your symptoms of pain. If otitis media does not resolve within 5 days or is recurrent, your health care provider may prescribe antibiotic medicines if he or she suspects that a bacterial infection is the cause. HOME CARE INSTRUCTIONS   Take your medicine as directed until it is gone, even if you feel better after the first few days.  Only take over-the-counter or prescription medicines for pain, discomfort, or fever as directed by your health care provider.  Follow up with your health care provider as directed. SEEK MEDICAL CARE IF:  You have otitis media only in one ear or bleeding from your nose or both.  You notice a lump on your neck.  You are not getting better in 3 5 days.  You feel worse instead of better. SEEK IMMEDIATE MEDICAL CARE IF:   You have pain that is not controlled with medicine.  You have swelling, redness, or pain around your ear or stiffness in your neck.  You notice that part of your face is paralyzed.  You notice that the bone behind your ear (mastoid) is tender when you touch it. MAKE SURE YOU:   Understand these instructions.  Will watch your condition.  Will get help right away if you are not doing well or get worse. Document Released: 08/03/2004 Document Revised: 08/19/2013 Document Reviewed: 05/26/2013 [facility] Patient Information 2014 [place], [place].  Sciatica Sciatica is pain, weakness,  numbness, or tingling along the path of the sciatic nerve. The nerve starts in the lower back and runs down the back of each leg. The nerve controls the muscles in the lower leg and in the back of the knee, while also providing sensation to the back of the thigh, lower leg, and the sole of your foot. Sciatica is a symptom of another medical condition. For instance, nerve damage or certain conditions, such as a herniated disk or bone spur on the spine, pinch or put pressure on the sciatic nerve. This causes the pain, weakness, or other sensations normally associated with sciatica. Generally, sciatica only affects one  side of the body. CAUSES   Herniated or slipped disc.  Degenerative disk disease.  A pain disorder involving the narrow muscle in the buttocks (piriformis syndrome).  Pelvic injury or fracture.  Pregnancy.  Tumor (rare). SYMPTOMS  Symptoms can vary from mild to very severe. The symptoms usually travel from the low back to the buttocks and down the back of the leg. Symptoms can include:  Mild tingling or dull aches in the lower back, leg, or hip.  Numbness in the back of the calf or sole of the foot.  Burning sensations in the lower back, leg, or hip.  Sharp pains in the lower back, leg, or hip.  Leg weakness.  Severe back pain inhibiting movement. These symptoms may get worse with coughing, sneezing, laughing, or prolonged sitting or standing. Also, being overweight may worsen symptoms. DIAGNOSIS  Your caregiver will perform a physical exam to look for common symptoms of sciatica. He or she may ask you to do certain movements or activities that would trigger sciatic nerve pain. Other tests may be performed to find the cause of the sciatica. These may include:  Blood tests.  X-rays.  Imaging tests, such as an MRI or CT scan. TREATMENT  Treatment is directed at the cause of the sciatic pain. Sometimes, treatment is not necessary and the pain and discomfort goes away on  its own. If treatment is needed, your caregiver may suggest:  Over-the-counter medicines to relieve pain.  Prescription medicines, such as anti-inflammatory medicine, muscle relaxants, or narcotics.  Applying heat or ice to the painful area.  Steroid injections to lessen pain, irritation, and inflammation around the nerve.  Reducing activity during periods of pain.  Exercising and stretching to strengthen your abdomen and improve flexibility of your spine. Your caregiver may suggest losing weight if the extra weight makes the back pain worse.  Physical therapy.  Surgery to eliminate what is pressing or pinching the nerve, such as a bone spur or part of a herniated disk. HOME CARE INSTRUCTIONS   Only take over-the-counter or prescription medicines for pain or discomfort as directed by your caregiver.  Apply ice to the affected area for 20 minutes, 3 4 times a day for the first 48 72 hours. Then try heat in the same way.  Exercise, stretch, or perform your usual activities if these do not aggravate your pain.  Attend physical therapy sessions as directed by your caregiver.  Keep all follow-up appointments as directed by your caregiver.  Do not wear high heels or shoes that do not provide proper support.  Check your mattress to see if it is too soft. A firm mattress may lessen your pain and discomfort. SEEK IMMEDIATE MEDICAL CARE IF:   You lose control of your bowel or bladder (incontinence).  You have increasing weakness in the lower back, pelvis, buttocks, or legs.  You have redness or swelling of your back.  You have a burning sensation when you urinate.  You have pain that gets worse when you lie down or awakens you at night.  Your pain is worse than you have experienced in the past.  Your pain is lasting longer than 4 weeks.  You are suddenly losing weight without reason. MAKE SURE YOU:  Understand these instructions.  Will watch your condition.  Will get help  right away if you are not doing well or get worse. Document Released: 10/23/2001 Document Revised: 04/29/2012 Document Reviewed: 03/09/2012 [facility] Patient Information 2014 [place], [place].

## 2014-02-22 NOTE — ED Provider Notes (Signed)
CSN: [id number]     Arrival date & time 02/22/14  1055 History   This chart was scribed for Carlesha Seiple by Ladona Ridgelaylor Day, ED scribe. This patient was seen in room APA14/APA14 and the patient's care was started at 1055.  Chief Complaint  Patient presents with  . Sore Throat  . Leg Pain  . Rectal Bleeding   Patient is a 48 y.o. male presenting with pharyngitis, leg pain, and hematochezia. The history is provided by the patient. No language interpreter was used.  Sore Throat This is a new problem. The current episode started 2 days ago. The problem occurs constantly. The problem has not changed since onset.Pertinent negatives include no chest pain, no abdominal pain and no shortness of breath. Nothing aggravates the symptoms. Nothing relieves the symptoms. He has tried nothing for the symptoms.  Leg Pain Associated symptoms: back pain   Associated symptoms: no fever   Rectal Bleeding Associated symptoms: no abdominal pain, no fever and no vomiting   Sore Throat This is a new problem. The current episode started 2 days ago. The problem occurs constantly. The problem has not changed since onset.Pertinent negatives include no abdominal pain, chest pain, chills, coughing, fever, nausea or vomiting. Nothing aggravates the symptoms. He has tried nothing for the symptoms.   HPI Comments: William Steele is a 48 y.o. male who presents to the Emergency Department for constant, graudally worsened right hip pain for x5 days which radiates all the way down his right leg to his toes; no recent trauma or falls. He has not seen anyone for this problem. No relief w/ibuprofen. No incontinence or retention of bowel or bladder. He reports this problem has been ongoing for 20 years and occasionally it flares up.  He also complains of rectal bleeding after having a BM, has blood on TP. Every month or so he has episodes in which there is blood in the toilet w/a BM, this has been ongoing this week but not today. Occurs  when he has urge for BM. He reports associated decreased appetite to avoid having a BM. He also describes feeling some discomfort and urge to have a BM then goes to have a BM and passing only blood into the toilet. Recently when he does pass stool, he states that he does so about every other day and it has been a normal color of brown and denies any hard or dry stool. He denies any emesis episodes, fever, abdominal pain, or hematemesis. Last BM was yesterday.   He also reports a few days ago began with a sore throat and states that it feels similar to when he had ear problems. He used drops for his ears last time which seemed to help.   Past Medical History  Diagnosis Date  . Pneumonia   . Chronic back pain   . COPD (chronic obstructive pulmonary disease)   . DDD (degenerative disc disease), cervical   . Sciatic pain    History reviewed. No pertinent past surgical history. History reviewed. No pertinent family history. History  Substance Use Topics  . Smoking status: Current Every Day Smoker -- 1.00 packs/day    Types: Cigarettes  . Smokeless tobacco: Not on file  . Alcohol Use: No    Review of Systems  Constitutional: Negative for fever and chills.  HENT: Positive for ear pain.   Respiratory: Negative for cough and shortness of breath.   Cardiovascular: Negative for chest pain.  Gastrointestinal: Positive for hematochezia and anal bleeding. Negative  for nausea, vomiting, abdominal pain and diarrhea.  Musculoskeletal: Positive for back pain.  All other systems reviewed and are negative.   Allergies  Review of patient's allergies indicates no known allergies.  Home Medications   Current Outpatient Rx  Name  Route  Sig  Dispense  Refill  . ibuprofen (ADVIL,MOTRIN) 200 MG tablet   Oral   Take 1,000-1,200 mg by mouth 4 (four) times daily as needed for moderate pain.           Triage Vitals: BP 106/86  Pulse 71  Temp(Src) 97.7 F (36.5 C) (Oral)  Resp 18  Ht 6' (1.829 m)   Wt 135 lb (61.236 kg)  BMI 18.31 kg/m2  SpO2 100%  Physical Exam  Nursing note and vitals reviewed. Constitutional: He is oriented to person, place, and time. He appears well-developed and well-nourished. No distress.  HENT:  Head: Normocephalic and atraumatic.  Mouth/Throat: Oropharynx is clear and moist. No oropharyngeal exudate.  Purulent yellow drainage in right ear canal. TM is erythematous; no bulging.   Left TM is normal  Oropharynx is erythematous but no exudates or swelling.    Neck: Normal range of motion. Neck supple.  No anterior cervical lymphadenopathy No mastoid tenderness  Cardiovascular: Normal rate, regular rhythm, normal heart sounds and intact distal pulses.   No murmur heard. Pulmonary/Chest: Effort normal and breath sounds normal. No respiratory distress. He has no wheezes. He has no rales. He exhibits no tenderness.  Abdominal: Soft. He exhibits no distension and no mass. There is no tenderness. There is no rebound and no guarding.  Genitourinary: Rectal exam shows external hemorrhoid and internal hemorrhoid. Rectal exam shows no fissure, no mass, no tenderness and anal tone normal. Guaiac negative stool.  2 cm Pink, nonthrombosed, external hemorrhoid present.  Non bleeding.  Few, palpable internal hemorrhoids noted.  Heme negative guiac. Minimal stool in rectal vault.  Musculoskeletal: Normal range of motion. He exhibits tenderness. He exhibits no edema.       Lumbar back: He exhibits tenderness and pain. He exhibits normal range of motion, no swelling, no deformity, no laceration and normal pulse.  DP pulses are brisk and symmetrical.  Distal sensation intact.  Hip Flexors/Extensors are intact.  Pt has normal strength against resistance of bilateral lower extremities.    Tender to lower lumbar spine and right SI joint   Lymphadenopathy:    He has no cervical adenopathy.  Neurological: He is alert and oriented to person, place, and time. He has normal  strength. No sensory deficit. He exhibits normal muscle tone. Coordination and gait normal.  Reflex Scores:      Patellar reflexes are 2+ on the right side and 2+ on the left side.      Achilles reflexes are 2+ on the right side and 2+ on the left side. Skin: Skin is warm and dry. No rash noted.   ED Course  Procedures (including critical care time) DIAGNOSTIC STUDIES: Oxygen Saturation is 100% on room air, normal by my interpretation.    COORDINATION OF CARE: At 1205 PM Discussed treatment plan with patient which includes anusol HC cream, sitz baths, high fiber diet, flexeril, vicodin #15, and amoxil. Patient agrees.   Labs Review Labs Reviewed  POC OCCULT BLOOD, ED   Imaging Review No results found.   EKG Interpretation None      MDM   Final diagnoses:  Otitis media of right ear  Sciatica of left side  External hemorrhoid   Patient well appearing,  external hemorrhoid present,  No concerning sx's for acute abdomen.     I personally performed the services described in this documentation, which was scribed in my presence. The recorded information has been reviewed and is accurate.      Raylei Losurdo L. Athira Janowicz, PA-C 02/23/14 2210

## 2014-02-22 NOTE — ED Notes (Signed)
Pt presents with multiple complaints. Pt reports sore throat and pain with swallowing x3-4 days. Pt c/o lower back pain that radiates down left leg which he states is a chronic problem. Pt also reports blood in his stool. Pt states this is also a chronic issue but reports increased amounts of bright red blood "recently".

## 2014-02-25 NOTE — ED Provider Notes (Signed)
Medical screening examination/treatment/procedure(s) were performed by non-physician practitioner and as supervising physician I was immediately available for consultation/collaboration.   EKG Interpretation None        Deklynn Charlet W. Akeria Hedstrom, MD 02/25/14 1613 

## 2014-05-16 ENCOUNTER — Encounter (HOSPITAL_COMMUNITY): Payer: Self-pay | Admitting: Emergency Medicine

## 2014-05-16 ENCOUNTER — Emergency Department (HOSPITAL_COMMUNITY): Payer: Self-pay

## 2014-05-16 ENCOUNTER — Emergency Department (HOSPITAL_COMMUNITY)
Admission: EM | Admit: 2014-05-16 | Discharge: 2014-05-16 | Disposition: A | Discharge status: Home / Self Care | Payer: Self-pay | Attending: Emergency Medicine | Admitting: Emergency Medicine

## 2014-05-16 DIAGNOSIS — Z792 Long term (current) use of antibiotics: Secondary | ICD-10-CM | POA: Insufficient documentation

## 2014-05-16 DIAGNOSIS — IMO0002 Reserved for concepts with insufficient information to code with codable children: Secondary | ICD-10-CM | POA: Insufficient documentation

## 2014-05-16 DIAGNOSIS — F17200 Nicotine dependence, unspecified, uncomplicated: Secondary | ICD-10-CM | POA: Insufficient documentation

## 2014-05-16 DIAGNOSIS — M5030 Other cervical disc degeneration, unspecified cervical region: Secondary | ICD-10-CM | POA: Insufficient documentation

## 2014-05-16 DIAGNOSIS — M25519 Pain in unspecified shoulder: Secondary | ICD-10-CM | POA: Insufficient documentation

## 2014-05-16 DIAGNOSIS — J449 Chronic obstructive pulmonary disease, unspecified: Secondary | ICD-10-CM | POA: Insufficient documentation

## 2014-05-16 DIAGNOSIS — M25512 Pain in left shoulder: Secondary | ICD-10-CM

## 2014-05-16 DIAGNOSIS — J4489 Other specified chronic obstructive pulmonary disease: Secondary | ICD-10-CM | POA: Insufficient documentation

## 2014-05-16 DIAGNOSIS — G8929 Other chronic pain: Secondary | ICD-10-CM | POA: Insufficient documentation

## 2014-05-16 DIAGNOSIS — Z8701 Personal history of pneumonia (recurrent): Secondary | ICD-10-CM | POA: Insufficient documentation

## 2014-05-16 MED ORDER — CYCLOBENZAPRINE HCL 10 MG PO TABS: 10.0000 mg | ORAL_TABLET | Freq: Two times a day (BID) | ORAL | Status: DC | PRN

## 2014-05-16 MED ORDER — OXYCODONE-ACETAMINOPHEN 5-325 MG PO TABS
1.0000 | ORAL_TABLET | Freq: Once | ORAL | Status: AC
  Administered 2014-05-16: 1 via ORAL
  Filled 2014-05-16: qty 1

## 2014-05-16 MED ORDER — MELOXICAM 7.5 MG PO TABS: 7.5000 mg | ORAL_TABLET | Freq: Every day | ORAL | Status: DC

## 2014-05-16 MED ORDER — CYCLOBENZAPRINE HCL 10 MG PO TABS
10.0000 mg | ORAL_TABLET | Freq: Once | ORAL | Status: AC
  Administered 2014-05-16: 10 mg via ORAL
  Filled 2014-05-16: qty 1

## 2014-05-16 NOTE — ED Notes (Signed)
Pt c/o left shoulder pain that radiates down left arm, pain is worse with movement, use of left arm, denies any injury, states "I woke up like this", cms intact distal,

## 2014-05-16 NOTE — ED Provider Notes (Signed)
CSN: [id number]     Arrival date & time 05/16/14  1323 History   First MD Initiated Contact with Patient 05/16/14 1334     Chief Complaint  Patient presents with  . Shoulder Pain   Patient is a 48 y.o. male presenting with shoulder pain. The history is provided by the patient. No language interpreter was used.  Shoulder Pain Pertinent negatives include no chest pain and no shortness of breath.   This chart was scribed for nurse practitioner working with [name], MD, by Andrew Auaven Small, ED Scribe. This patient was seen in room APFT23/APFT23 and the patient's care was started at 1:34 PM.  William Steele is a 48 y.o. male who presents to the Emergency Department complaining of left shoulder pain x 9am this morning. Pt reports he woke up this morning with left shoulder pain that radiates down the entire left arm. Pt denies recent heavy lifting. States he sleeps on right ride due to back problems. Pt takes ibuprofen for his back problems. Pt denies similar pain in the past. Pt denies CP and SOB.  Past Medical History  Diagnosis Date  . Pneumonia   . Chronic back pain   . COPD (chronic obstructive pulmonary disease)   . DDD (degenerative disc disease), cervical   . Sciatic pain    History reviewed. No pertinent past surgical history. No family history on file. History  Substance Use Topics  . Smoking status: Current Every Day Smoker -- 1.00 packs/day    Types: Cigarettes  . Smokeless tobacco: Not on file  . Alcohol Use: No    Review of Systems  Respiratory: Negative for apnea, chest tightness and shortness of breath.   Cardiovascular: Negative for chest pain.  Musculoskeletal: Negative for neck pain and neck stiffness.       Left shoulder pain.  all other systems negative    Allergies  Review of patient's allergies indicates no known allergies.  Home Medications   Prior to Admission medications   Medication Sig Start Date End Date Taking? Authorizing Provider  amoxicillin  (AMOXIL) 500 MG capsule Take 1 capsule (500 mg total) by mouth 3 (three) times daily. 02/22/14   Tammy L. Triplett, PA-C  cyclobenzaprine (FLEXERIL) 10 MG tablet Take 1 tablet (10 mg total) by mouth 3 (three) times daily as needed. 02/22/14   Tammy L. Triplett, PA-C  HYDROcodone-acetaminophen (NORCO/VICODIN) 5-325 MG per tablet Take one-two tabs po q 4-6 hrs prn pain 02/22/14   Tammy L. Triplett, PA-C  hydrocortisone (ANUSOL-HC) 2.5 % rectal cream Apply rectally 2 times daily 02/22/14   Tammy L. Triplett, PA-C  ibuprofen (ADVIL,MOTRIN) 200 MG tablet Take 1,000-1,200 mg by mouth 4 (four) times daily as needed for moderate pain.     Historical Provider, MD  predniSONE (DELTASONE) 10 MG tablet Take 6 tablets day one, 5 tablets day two, 4 tablets day three, 3 tablets day four, 2 tablets day five, then 1 tablet day six 02/22/14   Tammy L. Triplett, PA-C   BP 128/88  Pulse 66  Temp(Src) 97.5 F (36.4 C) (Oral)  Resp 16  Ht 6' (1.829 m)  Wt 135 lb (61.236 kg)  BMI 18.31 kg/m2  SpO2 97% Physical Exam  Nursing note and vitals reviewed. Constitutional: He is oriented to person, place, and time. He appears well-developed and well-nourished. No distress.  HENT:  Head: Normocephalic and atraumatic.  Nose: Nose normal.  Mouth/Throat: Oropharynx is clear and moist.  Eyes: Conjunctivae and EOM are normal. Pupils are  equal, round, and reactive to light.  Neck: Normal range of motion. Neck supple.  Cardiovascular: Normal rate, regular rhythm, normal heart sounds and intact distal pulses.  Exam reveals no gallop and no friction rub.   No murmur heard. Good pulses. Adequate circulation  Pulmonary/Chest: Effort normal and breath sounds normal. He has no wheezes. He has no rales.  Musculoskeletal:  Limited ROM Good grip bilaterally Pain with rasing arm Pain with bringing arm forward  Neurological: He is alert and oriented to person, place, and time.  Skin: Skin is warm and dry.  Psychiatric: He has a normal  mood and affect. His behavior is normal.   Xray of left shoulder negative  ED Course  Procedures  2:26 PM-Discussed treatment plan which includes a sling and a referral to an orthopedist with pt at bedside and pt agreed to plan.    MDM  48 y.o. male with left shoulder pain. Stable for discharge without neurovascular deficits. I have reviewed this patient's vital signs, nurses notes, appropriate labs and imaging.  I have discussed findings and plan of care with the patient and he voices understanding and agrees with plan.  Sling, ice, rest and pain management.    Medication List    TAKE these medications       cyclobenzaprine 10 MG tablet  Commonly known as:  FLEXERIL  Take 1 tablet (10 mg total) by mouth 2 (two) times daily as needed for muscle spasms.     meloxicam 7.5 MG tablet  Commonly known as:  MOBIC  Take 1 tablet (7.5 mg total) by mouth daily.      ASK your doctor about these medications       amoxicillin 500 MG capsule  Commonly known as:  AMOXIL  Take 1 capsule (500 mg total) by mouth 3 (three) times daily.     HYDROcodone-acetaminophen 5-325 MG per tablet  Commonly known as:  NORCO/VICODIN  Take one-two tabs po q 4-6 hrs prn pain     hydrocortisone 2.5 % rectal cream  Commonly known as:  ANUSOL-HC  Apply rectally 2 times daily     ibuprofen 200 MG tablet  Commonly known as:  ADVIL,MOTRIN  Take 1,000-1,200 mg by mouth 4 (four) times daily as needed for moderate pain.     predniSONE 10 MG tablet  Commonly known as:  DELTASONE  Take 6 tablets day one, 5 tablets day two, 4 tablets day three, 3 tablets day four, 2 tablets day five, then 1 tablet day six        I personally performed the services described in this documentation, which was scribed in my presence. The recorded information has been reviewed and is accurate.     [facility] Orlene OchM Madailein Londo, [place]NP 05/27/14 1650

## 2014-05-17 NOTE — ED Provider Notes (Signed)
Medical screening examination/treatment/procedure(s) were performed by non-physician practitioner and as supervising physician I was immediately available for consultation/collaboration.   EKG Interpretation None       Hurman HornJohn M Jeniece Hannis, MD 05/17/14 1256

## 2014-05-25 DIAGNOSIS — K006 Disturbances in tooth eruption: Secondary | ICD-10-CM | POA: Insufficient documentation

## 2014-05-25 DIAGNOSIS — Z8739 Personal history of other diseases of the musculoskeletal system and connective tissue: Secondary | ICD-10-CM | POA: Insufficient documentation

## 2014-05-25 DIAGNOSIS — G8929 Other chronic pain: Secondary | ICD-10-CM | POA: Insufficient documentation

## 2014-05-25 DIAGNOSIS — F17200 Nicotine dependence, unspecified, uncomplicated: Secondary | ICD-10-CM | POA: Insufficient documentation

## 2014-05-25 DIAGNOSIS — IMO0002 Reserved for concepts with insufficient information to code with codable children: Secondary | ICD-10-CM | POA: Insufficient documentation

## 2014-05-25 DIAGNOSIS — Z8701 Personal history of pneumonia (recurrent): Secondary | ICD-10-CM | POA: Insufficient documentation

## 2014-05-25 DIAGNOSIS — K044 Acute apical periodontitis of pulpal origin: Secondary | ICD-10-CM | POA: Insufficient documentation

## 2014-05-25 DIAGNOSIS — J4489 Other specified chronic obstructive pulmonary disease: Secondary | ICD-10-CM | POA: Insufficient documentation

## 2014-05-25 DIAGNOSIS — J449 Chronic obstructive pulmonary disease, unspecified: Secondary | ICD-10-CM | POA: Insufficient documentation

## 2014-05-25 DIAGNOSIS — Z79899 Other long term (current) drug therapy: Secondary | ICD-10-CM | POA: Insufficient documentation

## 2014-05-26 ENCOUNTER — Emergency Department (HOSPITAL_COMMUNITY)
Admission: EM | Admit: 2014-05-26 | Discharge: 2014-05-26 | Disposition: A | Discharge status: Home / Self Care | Payer: Self-pay | Attending: Emergency Medicine | Admitting: Emergency Medicine

## 2014-05-26 ENCOUNTER — Encounter (HOSPITAL_COMMUNITY): Payer: Self-pay | Admitting: Emergency Medicine

## 2014-05-26 DIAGNOSIS — K047 Periapical abscess without sinus: Secondary | ICD-10-CM

## 2014-05-26 MED ORDER — HYDROCODONE-ACETAMINOPHEN 5-325 MG PO TABS
1.0000 | ORAL_TABLET | Freq: Once | ORAL | Status: AC
  Administered 2014-05-26: 1 via ORAL
  Filled 2014-05-26: qty 1

## 2014-05-26 MED ORDER — HYDROCODONE-ACETAMINOPHEN 5-325 MG PO TABS: 1.0000 | ORAL_TABLET | ORAL | Status: DC | PRN

## 2014-05-26 MED ORDER — AMOXICILLIN 500 MG PO CAPS: 500.0000 mg | ORAL_CAPSULE | Freq: Three times a day (TID) | ORAL | Status: AC

## 2014-05-26 MED ORDER — AMOXICILLIN 250 MG PO CAPS
500.0000 mg | ORAL_CAPSULE | Freq: Once | ORAL | Status: AC
  Administered 2014-05-26: 500 mg via ORAL
  Filled 2014-05-26: qty 2

## 2014-05-26 NOTE — ED Provider Notes (Signed)
CSN: [id number]     Arrival date & time 05/25/14  2356 History   First MD Initiated Contact with Patient 05/26/14 0022     Chief Complaint  Patient presents with  . Sore Throat     (Consider location/radiation/quality/duration/timing/severity/associated sxs/prior Treatment) The history is provided by the patient.    William Steele is a 48 y.o. male presenting with dental pain which has been worsened since he extracted his right upper 2nd molar tooth 2 days ago.  He describes having a dental abscess at the site and pulled the tooth himself.  He now has pain which radiates into his right cheek and into his jaw and has developed swollen lymph node in his neck.   The patient has a history of injury and/or decay in the tooth involved which has recently started to cause increased  pain.  There has been no fevers, chills, nausea or vomiting, also no complaint of difficulty swallowing, although chewing makes pain worse.  The patient has taken ibuprofen without relief of pain.   Past Medical History  Diagnosis Date  . Pneumonia   . Chronic back pain   . COPD (chronic obstructive pulmonary disease)   . DDD (degenerative disc disease), cervical   . Sciatic pain    History reviewed. No pertinent past surgical history. History reviewed. No pertinent family history. History  Substance Use Topics  . Smoking status: Current Every Day Smoker -- 1.00 packs/day    Types: Cigarettes  . Smokeless tobacco: Not on file  . Alcohol Use: No    Review of Systems  Constitutional: Negative for fever.  HENT: Positive for dental problem. Negative for facial swelling and sore throat.   Respiratory: Negative for shortness of breath.   Musculoskeletal: Negative for neck pain and neck stiffness.      Allergies  Review of patient's allergies indicates no known allergies.  Home Medications   Prior to Admission medications   Medication Sig Start Date End Date Taking? Authorizing Provider  ibuprofen  (ADVIL,MOTRIN) 200 MG tablet Take 1,000-1,200 mg by mouth 4 (four) times daily as needed for moderate pain.    Yes Historical Provider, MD  amoxicillin (AMOXIL) 500 MG capsule Take 1 capsule (500 mg total) by mouth 3 (three) times daily. 02/22/14   Tammy L. Triplett, PA-C  amoxicillin (AMOXIL) 500 MG capsule Take 1 capsule (500 mg total) by mouth 3 (three) times daily. 05/26/14 06/05/14  Burgess Amor, PA-C  cyclobenzaprine (FLEXERIL) 10 MG tablet Take 1 tablet (10 mg total) by mouth 2 (two) times daily as needed for muscle spasms. 05/16/14   Hope Orlene Och, NP  HYDROcodone-acetaminophen (NORCO/VICODIN) 5-325 MG per tablet Take one-two tabs po q 4-6 hrs prn pain 02/22/14   Tammy L. Triplett, PA-C  HYDROcodone-acetaminophen (NORCO/VICODIN) 5-325 MG per tablet Take 1 tablet by mouth every 4 (four) hours as needed. 05/26/14   Burgess Amor, PA-C  hydrocortisone (ANUSOL-HC) 2.5 % rectal cream Apply rectally 2 times daily 02/22/14   Tammy L. Triplett, PA-C  meloxicam (MOBIC) 7.5 MG tablet Take 1 tablet (7.5 mg total) by mouth daily. 05/16/14   Hope Orlene Och, NP  predniSONE (DELTASONE) 10 MG tablet Take 6 tablets day one, 5 tablets day two, 4 tablets day three, 3 tablets day four, 2 tablets day five, then 1 tablet day six 02/22/14   Tammy L. Triplett, PA-C   BP 134/95  Pulse 67  Temp(Src) 98.6 F (37 C) (Oral)  Resp 18  Ht 6' (1.829 m)  [employer]  135 lb (61.236 kg)  BMI 18.31 kg/m2  SpO2 96% Physical Exam  Constitutional: He is oriented to person, place, and time. He appears well-developed and well-nourished. No distress.  HENT:  Head: Normocephalic and atraumatic.  Right Ear: Tympanic membrane and external ear normal.  Left Ear: Tympanic membrane and external ear normal.  Mouth/Throat: Oropharynx is clear and moist and mucous membranes are normal. No oral lesions. No trismus in the jaw. Abnormal dentition. No uvula swelling.  Pt is nearly edentulous.  He has several upper  Teeth still present with severe gingival  recession.  New socket site at right upper 2nd molar space.  There is no bleeding or purulent drainage at this site.    Eyes: Conjunctivae are normal.  Neck: Normal range of motion. Neck supple.  Cardiovascular: Normal rate and normal heart sounds.   Pulmonary/Chest: Effort normal.  Abdominal: He exhibits no distension.  Musculoskeletal: Normal range of motion.  Lymphadenopathy:       Head (right side): Tonsillar adenopathy present.    He has no cervical adenopathy.  Neurological: He is alert and oriented to person, place, and time.  Skin: Skin is warm and dry. No erythema.  Psychiatric: He has a normal mood and affect.    ED Course  Procedures (including critical care time) Labs Review Labs Reviewed - No data to display  Imaging Review No results found.   EKG Interpretation None      MDM   Final diagnoses:  Dental infection    Pt with dental mouth pain with radiation into right sinus and along right neck with right tonsillar lymphadenopathy.  He was prescribed amoxil, hydrocodone, dental referrals given.    Burgess AmorJulie Yulissa Needham, PA-C 05/26/14 [phone]

## 2014-05-26 NOTE — Discharge Instructions (Signed)
Dental Pain °A tooth ache may be caused by cavities (tooth decay). Cavities expose the nerve of the tooth to air and hot or cold temperatures. It may come from an infection or abscess (also called a boil or furuncle) around your tooth. It is also often caused by dental caries (tooth decay). This causes the pain you are having. °DIAGNOSIS  °Your caregiver can diagnose this problem by exam. °TREATMENT  °· If caused by an infection, it may be treated with medications which kill germs (antibiotics) and pain medications as prescribed by your caregiver. Take medications as directed. °· Only take over-the-counter or prescription medicines for pain, discomfort, or fever as directed by your caregiver. °· Whether the tooth ache today is caused by infection or dental disease, you should see your dentist as soon as possible for further care. °SEEK MEDICAL CARE IF: °The exam and treatment you received today has been provided on an emergency basis only. This is not a substitute for complete medical or dental care. If your problem worsens or new problems (symptoms) appear, and you are unable to meet with your dentist, call or return to this location. °SEEK IMMEDIATE MEDICAL CARE IF:  °· You have a fever. °· You develop redness and swelling of your face, jaw, or neck. °· You are unable to open your mouth. °· You have severe pain uncontrolled by pain medicine. °MAKE SURE YOU:  °· Understand these instructions. °· Will watch your condition. °· Will get help right away if you are not doing well or get worse. °Document Released: 10/29/2005 Document Revised: 01/21/2012 Document Reviewed: 06/16/2008 °[employer]® Patient Information ©2015 [employer]. This information is not intended to replace advice given to you by your health care provider. Make sure you discuss any questions you have with your health care provider. ° ° °Complete your entire course of antibiotics as prescribed.  You  may use the hydrocodone for pain relief but do not  drive within 4 hours of taking as this will make you drowsy.  Avoid applying heat or ice to this abscess area which can worsen your symptoms.  You may use warm salt water swish and spit treatment or half peroxide and water swish and spit after meals to keep this area clean as discussed.  Call the dentist listed above for further management of your symptoms. ° ° °

## 2014-05-26 NOTE — ED Notes (Signed)
J Idol, PA in to assess.

## 2014-05-26 NOTE — ED Provider Notes (Signed)
Medical screening examination/treatment/procedure(s) were performed by non-physician practitioner and as supervising physician I was immediately available for consultation/collaboration.   Dione Boozeavid Tadhg Eskew, MD 05/26/14 [phone]

## 2014-05-26 NOTE — ED Notes (Signed)
Pt c/o abscess tooth that he pulled Sunday and since then c/o throat pain.

## 2014-05-28 NOTE — ED Provider Notes (Signed)
Medical screening examination/treatment/procedure(s) were performed by non-physician practitioner and as supervising physician I was immediately available for consultation/collaboration.   EKG Interpretation None       William HornJohn M Shatha Hooser, MD 05/28/14 [phone]

## 2014-10-23 IMAGING — CR DG LUMBAR SPINE COMPLETE 4+V
5 series · 5 of 5 positions shown · non-contrast
Comparison: 03/29/2013.

CLINICAL DATA: Low back pain.  Right posterior hip pain.

LUMBAR SPINE - COMPLETE 4+ VIEW

[view not recorded (1 of 5)]
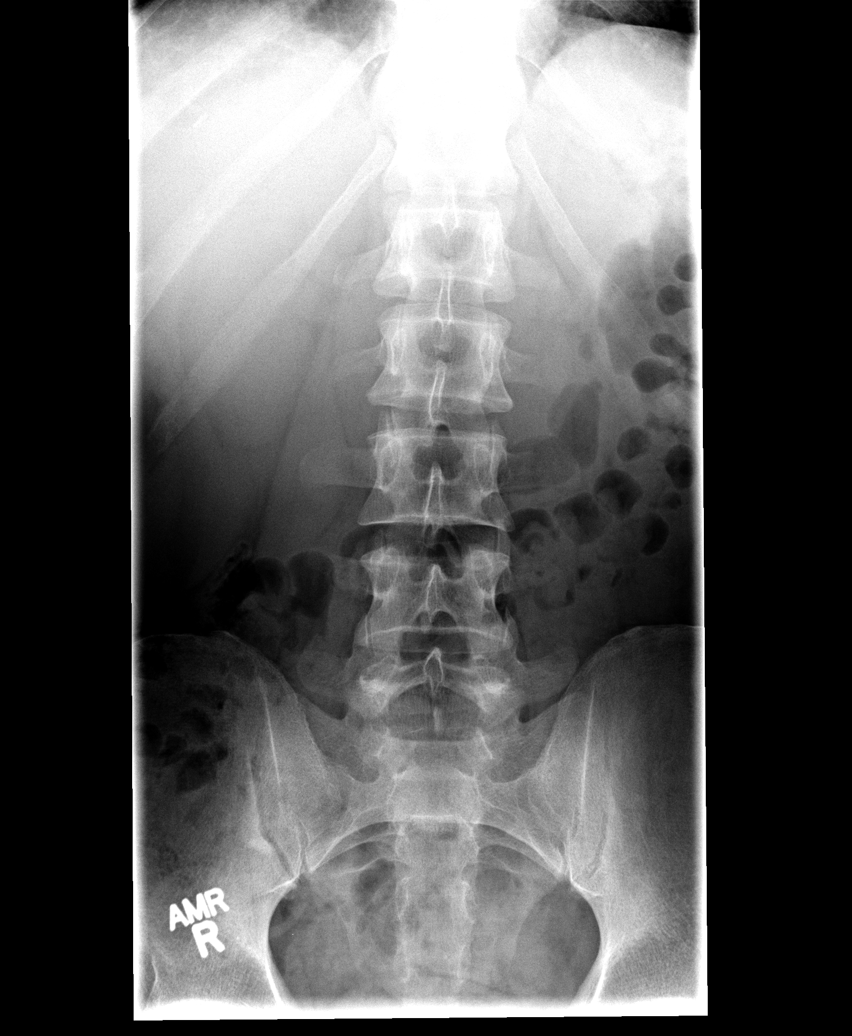

[view not recorded (2 of 5)]
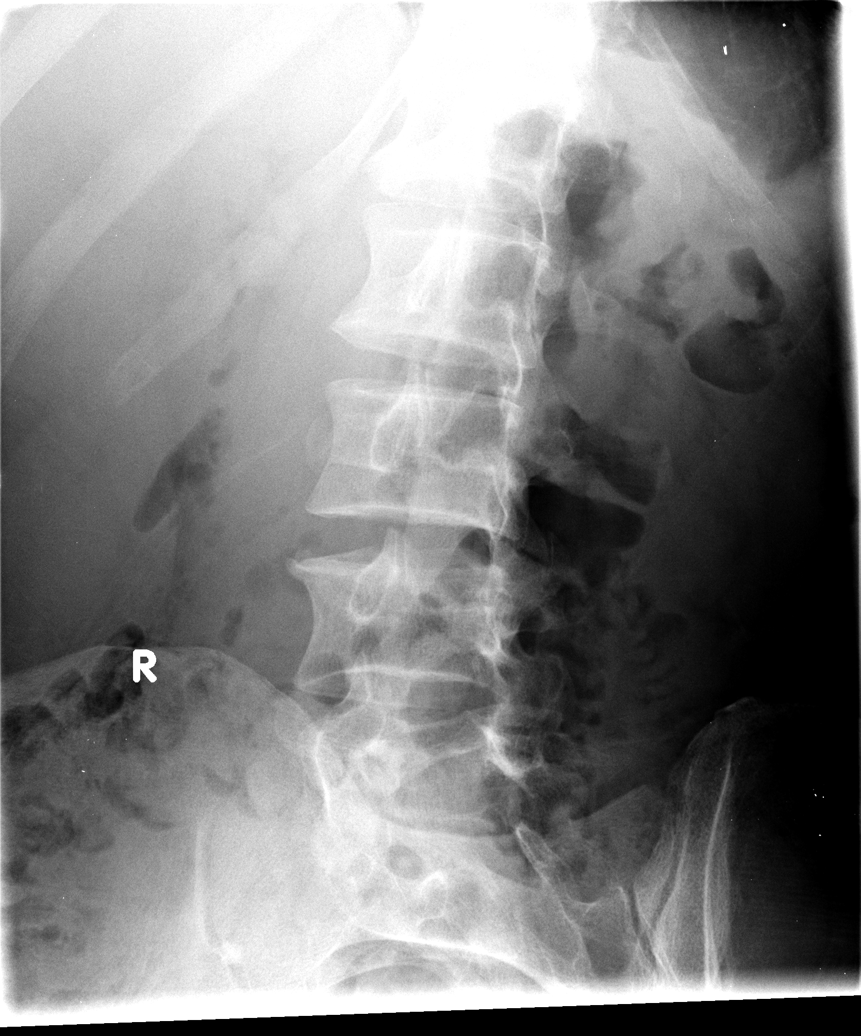

[view not recorded (3 of 5)]
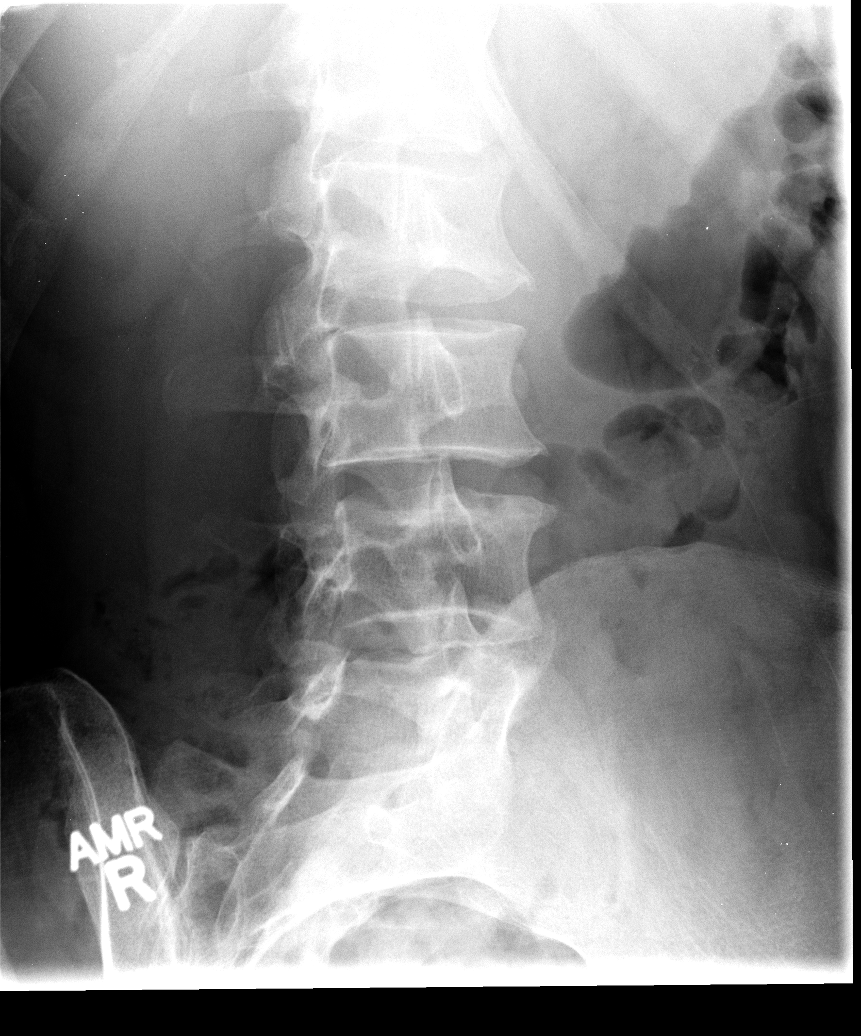

[view not recorded (4 of 5)]
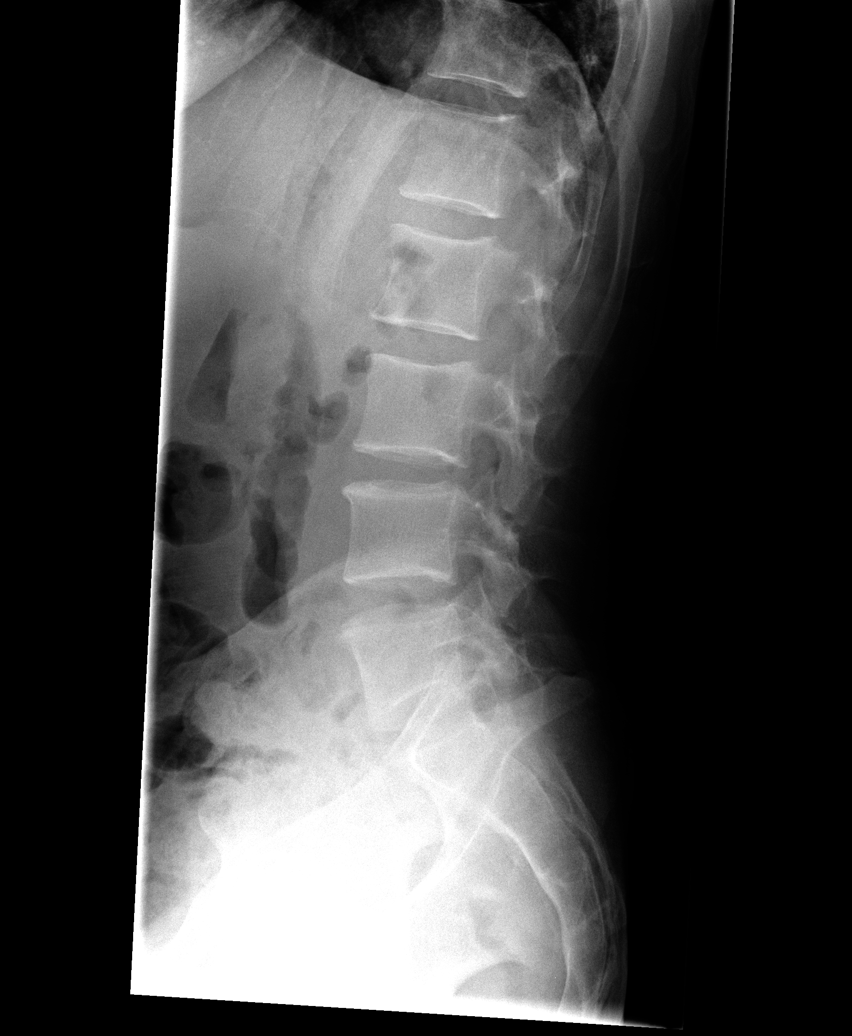

[view not recorded (5 of 5)]
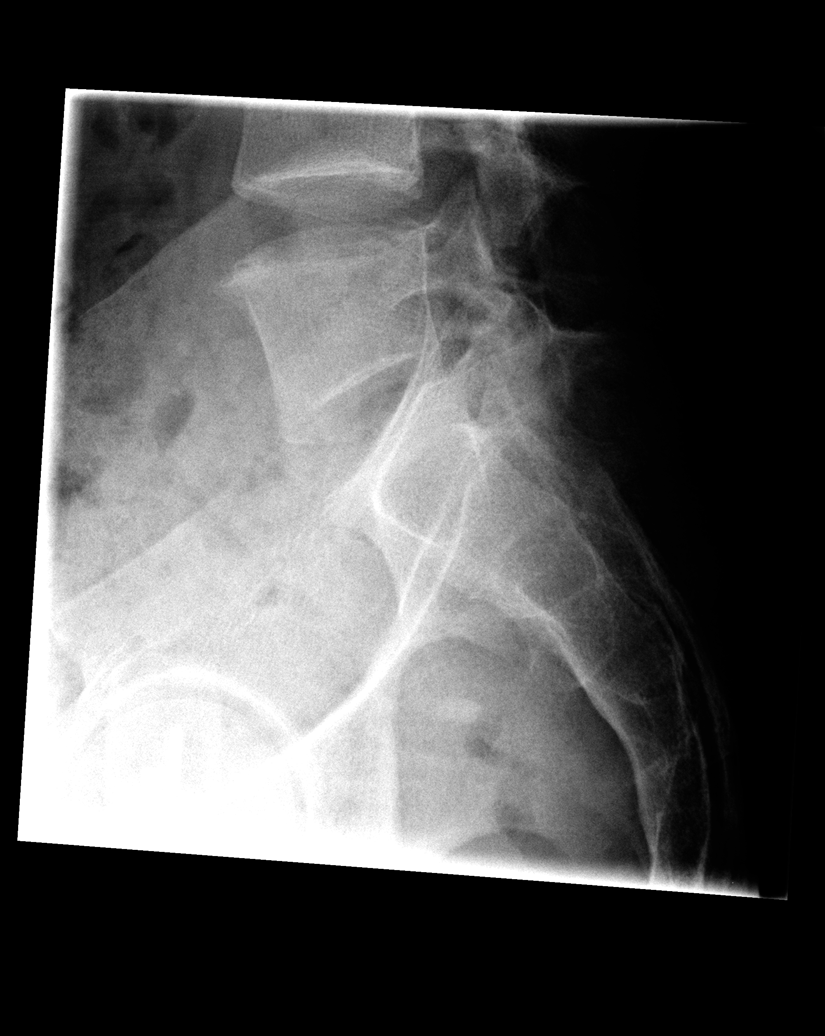

[5 of 5 positions shown; findings below may reference images not displayed]

FINDINGS: The alignment is unchanged compared to prior exam with
mild dextroconvex curve.  The apex is at L4.  The vertebral body
height is preserved.  The mild degenerative disc disease at L3-L4
and L4-L5.  Lumbosacral junction appears normal. No fracture.
IMPRESSION: No interval change.  Mild lumbar spondylosis.

## 2015-02-13 ENCOUNTER — Emergency Department (HOSPITAL_COMMUNITY)
Admission: EM | Admit: 2015-02-13 | Discharge: 2015-02-14 | Disposition: A | Discharge status: Home / Self Care | Payer: Self-pay | Attending: Emergency Medicine | Admitting: Emergency Medicine

## 2015-02-13 ENCOUNTER — Encounter (HOSPITAL_COMMUNITY): Payer: Self-pay | Admitting: *Deleted

## 2015-02-13 ENCOUNTER — Emergency Department (HOSPITAL_COMMUNITY): Payer: Self-pay

## 2015-02-13 DIAGNOSIS — G8929 Other chronic pain: Secondary | ICD-10-CM | POA: Insufficient documentation

## 2015-02-13 DIAGNOSIS — Z792 Long term (current) use of antibiotics: Secondary | ICD-10-CM | POA: Insufficient documentation

## 2015-02-13 DIAGNOSIS — Z8739 Personal history of other diseases of the musculoskeletal system and connective tissue: Secondary | ICD-10-CM | POA: Insufficient documentation

## 2015-02-13 DIAGNOSIS — Z720 Tobacco use: Secondary | ICD-10-CM | POA: Insufficient documentation

## 2015-02-13 DIAGNOSIS — J441 Chronic obstructive pulmonary disease with (acute) exacerbation: Secondary | ICD-10-CM | POA: Insufficient documentation

## 2015-02-13 DIAGNOSIS — Z7952 Long term (current) use of systemic steroids: Secondary | ICD-10-CM | POA: Insufficient documentation

## 2015-02-13 DIAGNOSIS — Z8701 Personal history of pneumonia (recurrent): Secondary | ICD-10-CM | POA: Insufficient documentation

## 2015-02-13 DIAGNOSIS — Z791 Long term (current) use of non-steroidal anti-inflammatories (NSAID): Secondary | ICD-10-CM | POA: Insufficient documentation

## 2015-02-13 MED ORDER — ALBUTEROL (5 MG/ML) CONTINUOUS INHALATION SOLN
15.0000 mg | INHALATION_SOLUTION | Freq: Once | RESPIRATORY_TRACT | Status: AC
  Administered 2015-02-13: 15 mg via RESPIRATORY_TRACT
  Filled 2015-02-13: qty 20

## 2015-02-13 MED ORDER — PREDNISONE 50 MG PO TABS
60.0000 mg | ORAL_TABLET | Freq: Once | ORAL | Status: AC
  Administered 2015-02-13: 60 mg via ORAL
  Filled 2015-02-13 (×2): qty 1

## 2015-02-13 MED ORDER — DOXYCYCLINE HYCLATE 100 MG PO TABS
100.0000 mg | ORAL_TABLET | Freq: Two times a day (BID) | ORAL | Status: DC
  Administered 2015-02-13: 100 mg via ORAL
  Filled 2015-02-13: qty 1

## 2015-02-13 NOTE — ED Provider Notes (Signed)
CSN: [id number]     Arrival date & time 02/13/15  2149 History   First MD Initiated Contact with Patient 02/13/15 2328     Chief Complaint  Patient presents with  . Cough     (Consider location/radiation/quality/duration/timing/severity/associated sxs/prior Treatment) HPI   William Steele is a 49 y.o. male who presents for evaluation of cough, nonproductive, but persistent for 2 days. He has been using his albuterol nebulizer, without relief. He continues to smoke. Has COPD. He denies fever, nausea, vomiting, weakness or dizziness. There are no other known modifying factors.   Past Medical History  Diagnosis Date  . Pneumonia   . Chronic back pain   . COPD (chronic obstructive pulmonary disease)   . DDD (degenerative disc disease), cervical   . Sciatic pain    History reviewed. No pertinent past surgical history. No family history on file. History  Substance Use Topics  . Smoking status: Current Every Day Smoker -- 1.00 packs/day    Types: Cigarettes  . Smokeless tobacco: Not on file  . Alcohol Use: No    Review of Systems  All other systems reviewed and are negative.     Allergies  Review of patient's allergies indicates no known allergies.  Home Medications   Prior to Admission medications   Medication Sig Start Date End Date Taking? Authorizing Provider  amoxicillin (AMOXIL) 500 MG capsule Take 1 capsule (500 mg total) by mouth 3 (three) times daily. 02/22/14   Tammi Triplett, PA-C  cyclobenzaprine (FLEXERIL) 10 MG tablet Take 1 tablet (10 mg total) by mouth 2 (two) times daily as needed for muscle spasms. 05/16/14   Hope Orlene Och, NP  doxycycline (VIBRAMYCIN) 100 MG capsule Take 1 capsule (100 mg total) by mouth 2 (two) times daily. One po bid x 7 days 02/14/15   Mancel Bale, MD  HYDROcodone-acetaminophen (NORCO/VICODIN) 5-325 MG per tablet Take one-two tabs po q 4-6 hrs prn pain 02/22/14   Tammi Triplett, PA-C  HYDROcodone-acetaminophen (NORCO/VICODIN) 5-325 MG per  tablet Take 1 tablet by mouth every 4 (four) hours as needed. 05/26/14   Burgess Amor, PA-C  hydrocortisone (ANUSOL-HC) 2.5 % rectal cream Apply rectally 2 times daily 02/22/14   Tammi Triplett, PA-C  ibuprofen (ADVIL,MOTRIN) 200 MG tablet Take 1,000-1,200 mg by mouth 4 (four) times daily as needed for moderate pain.     Historical Provider, MD  meloxicam (MOBIC) 7.5 MG tablet Take 1 tablet (7.5 mg total) by mouth daily. 05/16/14   Hope Orlene Och, NP  predniSONE (DELTASONE) 20 MG tablet Take 1 tablet (20 mg total) by mouth 2 (two) times daily. 02/14/15   Mancel Bale, MD   BP 133/82 mmHg  Pulse 91  Temp(Src) 98 F (36.7 C) (Oral)  Resp 24  Ht 6' (1.829 m)  Wt 135 lb (61.236 kg)  BMI 18.31 kg/m2  SpO2 98% Physical Exam  Constitutional: He is oriented to person, place, and time. He appears well-developed and well-nourished. He appears distressed (persistent cough, nonproductive).  HENT:  Head: Normocephalic and atraumatic.  Right Ear: External ear normal.  Left Ear: External ear normal.  Eyes: Conjunctivae and EOM are normal. Pupils are equal, round, and reactive to light.  Neck: Normal range of motion and phonation normal. Neck supple.  Cardiovascular: Normal rate, regular rhythm and normal heart sounds.   Pulmonary/Chest: Effort normal. No respiratory distress (no increased work of breathing). He has no rales. He exhibits no tenderness and no bony tenderness.  Decreased bilaterally with few scattered wheezes.  Abdominal: Soft. There is no tenderness.  Musculoskeletal: Normal range of motion.  Neurological: He is alert and oriented to person, place, and time. No cranial nerve deficit or sensory deficit. He exhibits normal muscle tone. Coordination normal.  Skin: Skin is warm, dry and intact.  Psychiatric: He has a normal mood and affect. His behavior is normal. Judgment and thought content normal.  Nursing note and vitals reviewed.   ED Course  Procedures (including critical care  time)  Medications  doxycycline (VIBRA-TABS) tablet 100 mg (100 mg Oral Given 02/13/15 2348)  albuterol (PROVENTIL,VENTOLIN) solution continuous neb (15 mg Nebulization Given 02/13/15 2359)  predniSONE (DELTASONE) tablet 60 mg (60 mg Oral Given 02/13/15 2348)    Patient Vitals for the past 24 hrs:  BP Temp Temp src Pulse Resp SpO2 Height Weight  02/13/15 2359 - - - - - 98 % - -  02/13/15 2224 133/82 mmHg 98 F (36.7 C) Oral 91 24 100 % 6' (1.829 m) 135 lb (61.236 kg)      Labs Review Labs Reviewed - No data to display  Imaging Review Dg Chest 2 View  02/13/2015   CLINICAL DATA:  Cough  EXAM: CHEST  2 VIEW  COMPARISON:  04/14/2013  FINDINGS: Hyperinflation and apical emphysematous change. There is a trace left pleural effusion. No pneumonia or edema suspected. Normal heart size and aortic contours. No acute osseous findings.  IMPRESSION: 1. Trace left pleural effusion. 2. Emphysema.   Electronically Signed   By: Marnee SpringJonathon  Watts M.D.   On: 02/13/2015 23:26     EKG Interpretation   Date/Time:  Sunday February 13 2015 22:16:37 EDT Ventricular Rate:  73 PR Interval:  108 QRS Duration: 92 QT Interval:  384 QTC Calculation: 423 R Axis:   69 Text Interpretation:  Sinus rhythm with short PR Nonspecific T wave  abnormality Abnormal ECG since last tracing no significant change  Confirmed by Effie ShyWENTZ  MD, Mechele CollinELLIOTT ([id number]) on 02/13/2015 11:34:27 PM      MDM   Final diagnoses:  COPD exacerbation  Tobacco abuse    COPD exacerbation with ongoing tobacco abuse. No evidence for pneumonia, metabolic instability or serious bacterial infection.   Nursing Notes Reviewed/ Care Coordinated Applicable Imaging Reviewed Interpretation of Laboratory Data incorporated into ED treatment  The patient appears reasonably screened and/or stabilized for discharge and I doubt any other medical condition or other [facility] requiring further screening, evaluation, or treatment in the ED at this time prior to  discharge.  Plan: Home Medications- Doxycycline, Prednisone; Home Treatments- rest; return here if the recommended treatment, does not improve the symptoms; Recommended follow up- PCP prn     Mancel BaleElliott Delron Comer, MD 02/14/15 0025

## 2015-02-13 NOTE — ED Notes (Signed)
Pt c/o cough that is non productive, pain in mid posterior back area that is worse with movement, cough, denies any fevers, chills, states that he has been using nebulizer at home with little improvement in symptoms, pt also states that he "blacked out" Tuesday for about a minute, was told by friends that his speech sounded as if he was stuttering, pt states that he still feels like he has problems with talking, speech clear in triage.

## 2015-02-14 MED ORDER — DOXYCYCLINE HYCLATE 100 MG PO CAPS: 100.0000 mg | ORAL_CAPSULE | Freq: Two times a day (BID) | ORAL | Status: DC

## 2015-02-14 MED ORDER — PREDNISONE 20 MG PO TABS: 20.0000 mg | ORAL_TABLET | Freq: Two times a day (BID) | ORAL | Status: DC

## 2015-02-14 NOTE — Discharge Instructions (Signed)
It is important to stop smoking. Start the antibiotic prescription in the morning. Follow up with a primary care doctor in one or 2 weeks.     Chronic Obstructive Pulmonary Disease Exacerbation Chronic obstructive pulmonary disease (COPD) is a common lung condition in which airflow from the lungs is limited. COPD is a general term that can be used to describe many different lung problems that limit airflow, including chronic bronchitis and emphysema. COPD exacerbations are episodes when breathing symptoms become much worse and require extra treatment. Without treatment, COPD exacerbations can be life threatening, and frequent COPD exacerbations can cause further damage to your lungs. CAUSES   Respiratory infections.   Exposure to smoke.   Exposure to air pollution, chemical fumes, or dust. Sometimes there is no apparent cause or trigger. RISK FACTORS  Smoking cigarettes.  Older age.  Frequent prior COPD exacerbations. SIGNS AND SYMPTOMS   Increased coughing.   Increased thick spit (sputum) production.   Increased wheezing.   Increased shortness of breath.   Rapid breathing.   Chest tightness. DIAGNOSIS  Your medical history, a physical exam, and tests will help your health care provider make a diagnosis. Tests may include:  A chest X-ray.  Basic lab tests.  Sputum testing.  An arterial blood gas test. TREATMENT  Depending on the severity of your COPD exacerbation, you may need to be admitted to a hospital for treatment. Some of the treatments commonly used to treat COPD exacerbations are:   Antibiotic medicines.   Bronchodilators. These are drugs that expand the air passages. They may be given with an inhaler or nebulizer. Spacer devices may be needed to help improve drug delivery.  Corticosteroid medicines.  Supplemental oxygen therapy.  HOME CARE INSTRUCTIONS   Do not smoke. Quitting smoking is very important to prevent COPD from getting worse  and exacerbations from happening as often.  Avoid exposure to all substances that irritate the airway, especially to tobacco smoke.   If you were prescribed an antibiotic medicine, finish it all even if you start to feel better.  Take all medicines as directed by your health care provider.It is important to use correct technique with inhaled medicines.  Drink enough fluids to keep your urine clear or pale yellow (unless you have a medical condition that requires fluid restriction).  Use a cool mist vaporizer. This makes it easier to clear your chest when you cough.   If you have a home nebulizer and oxygen, continue to use them as directed.   Maintain all necessary vaccinations to prevent infections.   Exercise regularly.   Eat a healthy diet.   Keep all follow-up appointments as directed by your health care provider. SEEK IMMEDIATE MEDICAL CARE IF:  You have worsening shortness of breath.   You have trouble talking.   You have severe chest pain.  You have blood in your sputum.  You have a fever.  You have weakness, vomit repeatedly, or faint.   You feel confused.   You continue to get worse. MAKE SURE YOU:   Understand these instructions.  Will watch your condition.  Will get help right away if you are not doing well or get worse. Document Released: 08/26/2007 Document Revised: 03/15/2014 Document Reviewed: 07/03/2013 [facility] Patient Information 2015 [place], [place]. This information is not intended to replace advice given to you by your health care provider. Make sure you discuss any questions you have with your health care provider.  Smoking Cessation Quitting smoking is important to your health and  has many advantages. However, it is not always easy to quit since nicotine is a very addictive drug. Oftentimes, people try 3 times or more before being able to quit. This document explains the best ways for you to prepare to quit smoking. Quitting takes  hard work and a lot of effort, but you can do it. ADVANTAGES OF QUITTING SMOKING  You will live longer, feel better, and live better.  Your body will feel the impact of quitting smoking almost immediately.  Within 20 minutes, blood pressure decreases. Your pulse returns to its normal level.  After 8 hours, carbon monoxide levels in the blood return to normal. Your oxygen level increases.  After 24 hours, the chance of having a heart attack starts to decrease. Your breath, hair, and body stop smelling like smoke.  After 48 hours, damaged nerve endings begin to recover. Your sense of taste and smell improve.  After 72 hours, the body is virtually free of nicotine. Your bronchial tubes relax and breathing becomes easier.  After 2 to 12 weeks, lungs can hold more air. Exercise becomes easier and circulation improves.  The risk of having a heart attack, stroke, cancer, or lung disease is greatly reduced.  After 1 year, the risk of coronary heart disease is cut in half.  After 5 years, the risk of stroke falls to the same as a nonsmoker.  After 10 years, the risk of lung cancer is cut in half and the risk of other cancers decreases significantly.  After 15 years, the risk of coronary heart disease drops, usually to the level of a nonsmoker.  If you are pregnant, quitting smoking will improve your chances of having a healthy baby.  The people you live with, especially any children, will be healthier.  You will have extra money to spend on things other than cigarettes. QUESTIONS TO THINK ABOUT BEFORE ATTEMPTING TO QUIT You may want to talk about your answers with your health care provider.  Why do you want to quit?  If you tried to quit in the past, what helped and what did not?  What will be the most difficult situations for you after you quit? How will you plan to handle them?  Who can help you through the tough times? Your family? Friends? A health care provider?  What  pleasures do you get from smoking? What ways can you still get pleasure if you quit? Here are some questions to ask your health care provider:  How can you help me to be successful at quitting?  What medicine do you think would be best for me and how should I take it?  What should I do if I need more help?  What is smoking withdrawal like? How can I get information on withdrawal? GET READY  Set a quit date.  Change your environment by getting rid of all cigarettes, ashtrays, matches, and lighters in your home, car, or work. Do not let people smoke in your home.  Review your past attempts to quit. Think about what worked and what did not. GET SUPPORT AND ENCOURAGEMENT You have a better chance of being successful if you have help. You can get support in many ways.  Tell your family, friends, and coworkers that you are going to quit and need their support. Ask them not to smoke around you.  Get individual, group, or telephone counseling and support. Programs are available at [employer] and health centers. Call your local health department for information about programs in  your area.  Spiritual beliefs and practices may help some smokers quit.  Download a "quit meter" on your computer to keep track of quit statistics, such as how long you have gone without smoking, cigarettes not smoked, and money saved.  Get a self-help book about quitting smoking and staying off tobacco. LEARN NEW SKILLS AND BEHAVIORS  Distract yourself from urges to smoke. Talk to someone, go for a walk, or occupy your time with a task.  Change your normal routine. Take a different route to work. Drink tea instead of coffee. Eat breakfast in a different place.  Reduce your stress. Take a hot bath, exercise, or read a book.  Plan something enjoyable to do every day. Reward yourself for not smoking.  Explore interactive web-based programs that specialize in helping you quit. GET MEDICINE AND USE IT  CORRECTLY Medicines can help you stop smoking and decrease the urge to smoke. Combining medicine with the above behavioral methods and support can greatly increase your chances of successfully quitting smoking.  Nicotine replacement therapy helps deliver nicotine to your body without the negative effects and risks of smoking. Nicotine replacement therapy includes nicotine gum, lozenges, inhalers, nasal sprays, and skin patches. Some may be available over-the-counter and others require a prescription.  Antidepressant medicine helps people abstain from smoking, but how this works is unknown. This medicine is available by prescription.  Nicotinic receptor partial agonist medicine simulates the effect of nicotine in your brain. This medicine is available by prescription. Ask your health care provider for advice about which medicines to use and how to use them based on your health history. Your health care provider will tell you what side effects to look out for if you choose to be on a medicine or therapy. Carefully read the information on the package. Do not use any other product containing nicotine while using a nicotine replacement product.  RELAPSE OR DIFFICULT SITUATIONS Most relapses occur within the first 3 months after quitting. Do not be discouraged if you start smoking again. Remember, most people try several times before finally quitting. You may have symptoms of withdrawal because your body is used to nicotine. You may crave cigarettes, be irritable, feel very hungry, cough often, get headaches, or have difficulty concentrating. The withdrawal symptoms are only temporary. They are strongest when you first quit, but they will go away within 10-14 days. To reduce the chances of relapse, try to:  Avoid drinking alcohol. Drinking lowers your chances of successfully quitting.  Reduce the amount of caffeine you consume. Once you quit smoking, the amount of caffeine in your body increases and can  give you symptoms, such as a rapid heartbeat, sweating, and anxiety.  Avoid smokers because they can make you want to smoke.  Do not let weight gain distract you. Many smokers will gain weight when they quit, usually less than 10 pounds. Eat a healthy diet and stay active. You can always lose the weight gained after you quit.  Find ways to improve your mood other than smoking. FOR MORE INFORMATION  www.smokefree.gov  Document Released: 10/23/2001 Document Revised: 03/15/2014 Document Reviewed: 02/07/2012 [facility] Patient Information 2015 [place]. This information is not intended to replace advice given to you by your health care provider. Make sure you discuss any questions you have with your health care provider.  You Can Quit Smoking If you are ready to quit smoking or are thinking about it, congratulations! You have chosen to help yourself be healthier and live longer! There are  lots of different ways to quit smoking. Nicotine gum, nicotine patches, a nicotine inhaler, or nicotine nasal spray can help with physical craving. Hypnosis, support groups, and medicines help break the habit of smoking. TIPS TO GET OFF AND STAY OFF CIGARETTES  Learn to predict your moods. Do not let a bad situation be your excuse to have a cigarette. Some situations in your life might tempt you to have a cigarette.  Ask friends and co-workers not to smoke around you.  Make your home smoke-free.  Never have "just one" cigarette. It leads to wanting another and another. Remind yourself of your decision to quit.  On a card, make a list of your reasons for not smoking. Read it at least the same number of times a day as you have a cigarette. Tell yourself everyday, "I do not want to smoke. I choose not to smoke."  Ask someone at home or work to help you with your plan to quit smoking.  Have something planned after you eat or have a cup of coffee. Take a walk or get other exercise to perk you up. This will  help to keep you from overeating.  Try a relaxation exercise to calm you down and decrease your stress. Remember, you may be tense and nervous the first two weeks after you quit. This will pass.  Find new activities to keep your hands busy. Play with a pen, coin, or rubber band. Doodle or draw things on paper.  Brush your teeth right after eating. This will help cut down the craving for the taste of tobacco after meals. You can try mouthwash too.  Try gum, breath mints, or diet candy to keep something in your mouth. IF YOU SMOKE AND WANT TO QUIT:  Do not stock up on cigarettes. Never buy a carton. Wait until one pack is finished before you buy another.  Never carry cigarettes with you at work or at home.  Keep cigarettes as far away from you as possible. Leave them with someone else.  Never carry matches or a lighter with you.  Ask yourself, "Do I need this cigarette or is this just a reflex?"  Bet with someone that you can quit. Put cigarette money in a piggy bank every morning. If you smoke, you give up the money. If you do not smoke, by the end of the week, you keep the money.  Keep trying. It takes 21 days to change a habit!  Talk to your doctor about using medicines to help you quit. These include nicotine replacement gum, lozenges, or skin patches. Document Released: 08/25/2009 Document Revised: 01/21/2012 Document Reviewed: 08/25/2009 [facility] Patient Information 2015 [place], [place]. This information is not intended to replace advice given to you by your health care provider. Make sure you discuss any questions you have with your health care provider.    Emergency Department Resource Guide 1) Find a Doctor and Pay Out of Pocket Although you won't have to find out who is covered by your insurance plan, it is a good idea to ask around and get recommendations. You will then need to call the office and see if the doctor you have chosen will accept you as a new patient and what  types of options they offer for patients who are self-pay. Some doctors offer discounts or will set up payment plans for their patients who do not have insurance, but you will need to ask so you aren't surprised when you get to your appointment.  2) Contact  Your Local Health Department Not all health departments have doctors that can see patients for sick visits, but many do, so it is worth a call to see if yours does. If you don't know where your local health department is, you can check in your phone book. The CDC also has a tool to help you locate your state's health department, and many state websites also have listings of all of their local health departments.  3) Find a Walk-in Clinic If your illness is not likely to be very severe or complicated, you may want to try a walk in clinic. These are popping up all over the country in pharmacies, drugstores, and shopping centers. They're usually staffed by nurse practitioners or physician assistants that have been trained to treat common illnesses and complaints. They're usually fairly quick and inexpensive. However, if you have serious medical issues or chronic medical problems, these are probably not your best option.  No Primary Care Doctor: - Call Health Connect at  [phone] - they can help you locate a primary care doctor that  accepts your insurance, provides certain services, etc. - Physician Referral Service- [phone]  Chronic Pain Problems: Organization         Address  Phone   Notes  [facility]  [phone] Patients need to be referred by their primary care doctor.   Medication Assistance: Organization         Address  Phone   Notes  [facility] Medication [facility] [address] [place], [place] [address] [phone] --Must be a resident of [facility] -- Must have NO insurance coverage whatsoever (no Medicaid/ Medicare, etc.) -- The pt. MUST have a primary care doctor that  directs their care regularly and follows them in the community   [facility]  [phone]   [employer]  [phone]    Agencies that provide inexpensive medical care: Organization         Address  Phone   Notes  [facility]  [phone]   [facility]    [phone]   [facility] [address] [place], [place] [address] [phone]   [facility] [address], [place] [phone]   Planned Parenthood    [phone]   [facility]    [phone]   [facility]  [address], [place] Phone:  [phone], Fax:  [phone] Hours of Operation:  9 am - 6 pm, M-F.  Also accepts Medicaid/Medicare and self-pay.  [facility]  [address], [place] Phone: [phone], Fax: [phone]. Hours of Operation:  8:30 am - 5:30 pm, M-F.  Also accepts Medicaid and self-pay.  [facility] [place] [address] [place] Phone: [phone]   [facility] [address] Natasha Bence [place], [place] [phone], Ext. [phone] Mondays & Thursdays: 7-9 AM.  First 15 patients are seen on a first come, first serve basis.    Medicaid-accepting [facility] Providers:  Organization         Address  Phone   Notes  [facility] [address], [place] [phone] Also accepts self-pay patients.  [facility] [address] Laurell Josephs [place], [place]  [phone]   [facility] [address], [address] [phone])  [phone]   [facility] [address], [place] [phone]   Renaye Rakers [address], [place]   [phone] Only accepts [place] patients after they have their name applied to their card.   Self-Pay (no insurance) in [facility]:  Organization          Address  Phone   Notes  Sickle Cell Patients, [facility] [address] [place], [place] [phone]   [facility] [address] [place], [place] [phone]   [facility]  [address], [place] [phone]   [facility]/Dr. Osei-Bonsu  [address], [place] or [address], [place] [phone] Phone number for both [place] and [place] locations is the same.  [facility] [address] [place] [phone]   [facility] [address] [place] or [address] [phone]   [facility] [address] [place] [phone], phone; [phone], fax Sees patients 1st and 3rd Saturday of every month.  Must not qualify for public or private insurance (i.e. Medicaid, Medicare, [place] Health Choice, Veterans' Benefits)  Household income should be no more than 200% of the poverty level The clinic cannot treat you if you are pregnant or think you are pregnant  Sexually transmitted diseases are not treated at the clinic.    Dental Care: Organization         Address  Phone  Notes  [facility] Department of [facility] [address] [place], [place] [phone] Accepts children up to age 29 who are enrolled in [place] or [place] Health Choice; pregnant women with a Medicaid card; and children who have applied for Medicaid or [place] Health Choice, but were declined, whose parents can pay a reduced fee at time of service.  [facility] Department of [facility]  [address], [place] [phone] Accepts children up to age 62 who are enrolled in [place] or [place] Health Choice; pregnant women with a Medicaid card; and children who have applied for Medicaid or  Health Choice, but were declined, whose parents can pay a reduced fee at time of  service.  [facility]  [address] [place], [place] [phone] Patients are seen by appointment only. Walk-ins are not accepted. [facility] will see patients 25 years of age and older. Monday - Tuesday (8am-5pm) Most Wednesdays (8:30-5pm) $30 per visit, cash only  [facility]  [address], [facility] [phone] Patients are seen by appointment only. Walk-ins are not accepted. [facility] will see patients 55 years of age and older. One Wednesday Evening (Monthly: Volunteer Based).  $30 per visit, cash only  [employer]  [phone] for adults; Children under age 13, call Graduate Pediatric Dentistry at [phone]. Children aged 11-14, please call [phone] to request a pediatric application.  Dental services are provided in all areas of dental care including fillings, crowns and bridges, complete and partial dentures, implants, gum treatment, root canals, and extractions. Preventive care is also provided. Treatment is provided to both adults and children. Patients are selected via a lottery and there is often a waiting list.   [facility] [address], [place]in[place] Otto  [phone])  [phone] [email or web address]   [facility] [address] [place], [place] [phone], Ext. [phone] Second and Fourth Thursday of each month, opens at 6:30 AM; Clinic ends at 9 AM.  Patients are seen on a first-come first-served basis, and a limited number are seen during each clinic.   [facility]  [address] Ether Griffins [place], [place] [phone]   Eligibility Requirements You must have lived in [place], [place], or [place] counties for at least the last three months.   You cannot be eligible for state or federal sponsored [place], including [employer], [place], or [employer].   You generally cannot be eligible for healthcare insurance through your employer.     How to apply: Eligibility screenings are held every Tuesday and Wednesday afternoon from 1:00 pm until 4:00 pm. You do not need an appointment for the interview!  [facility] [address], [place], [place] [phone]   [facility] Health Department  [phone]   [facility] Health Department  [phone]   [facility] Health Department  [phone]    Behavioral Health Resources in the Community: Intensive Outpatient Programs Organization         Address  Phone  Notes  [facility] [address] [place], [place] [phone]   [facility] [address], [place], [place] [phone]   [facility] [address] [place], [place]  [phone]   [facility] Mental Health [address],  [place], [place] [phone] or [phone]   Substance Abuse Resources Organization         Address  Phone  Notes  [facility]  [phone]   [facility]  [phone]   [facility]  [phone]   Floydene Flock  [phone]   Residential & Outpatient Substance Abuse Program  [phone]   Psychological Services Organization         Address  Phone  Notes  [facility] Behavioral Health  [phone]   [facility] [place] [facility]  [phone]   [facility] [address] [place] [phone] or [phone]    Mobile Crisis Teams Organization         Address  Phone  Notes  [facility], Mobile Crisis Care Unit  [phone]   [facility]  [address] [place], [place] [phone]   Doristine Locks [address] [place] [phone]    Self-Help/Support Groups Organization         Address  Phone             Notes  [facility] [place] - variety of support groups  [phone]- [phone] Call for more information  Narcotics Anonymous (NA), [facility] [address]  Dr, [employer] [place]  2 meetings at this location   Statistician         Address  Phone  Notes  [facility] [address],    [place]  [phone]   [facility]  [address], [place] [address], [place], [place] [phone]   [facility] [address] [place], [place] [phone] Admissions: 8am-3pm M-F  [facility] [address],    [place], [place] [phone]   [facility] [address] [place], [place] [phone]   [facility] [address]  [place], [place] [phone]   [facility]  Intensive Outpatient [address], [place], [place] [phone]   [facility] [address] [place]  [place], [place] [phone] or [phone]   [facility] [address], [place], [place] [phone] Accepts Medicaid  [facility] [address],  [place] [phone] Substance Abuse/Addiction Treatment   [facility]         Address  Phone  Notes  [facility]  [phone]   Angie Fava, PhD [address] [place] [place], [place]   [phone] or [phone]   [facility]   [address] [place], [place] [phone]   [facility] [address], [place], [place] [phone] Insurance/Medicaid/sponsorship through [facility] and Families [address]                                    [place], [place] [phone] Therapy/tele-psych/case  [facility] [address] [place], [place] [phone]    Dr. Lolly Mustache  [phone]   [facility]  [facility] Dept. 1) [address], [place] 2) [address] [place] 3)  [address], [place] [phone]  [phone]   [facility]  Hotline [phone] or [phone] (After Hours)     \

## 2015-09-22 ENCOUNTER — Encounter (HOSPITAL_COMMUNITY): Payer: Self-pay

## 2015-09-22 ENCOUNTER — Ambulatory Visit (HOSPITAL_COMMUNITY): Admission: RE | Admit: 2015-09-22 | Payer: Self-pay | Source: Ambulatory Visit

## 2015-09-22 ENCOUNTER — Emergency Department (HOSPITAL_COMMUNITY): Payer: Self-pay

## 2015-09-22 ENCOUNTER — Emergency Department (HOSPITAL_COMMUNITY)
Admission: EM | Admit: 2015-09-22 | Discharge: 2015-09-22 | Discharge status: Against Medical Advice | Payer: Self-pay | Attending: Emergency Medicine | Admitting: Emergency Medicine

## 2015-09-22 DIAGNOSIS — G8929 Other chronic pain: Secondary | ICD-10-CM | POA: Insufficient documentation

## 2015-09-22 DIAGNOSIS — F17210 Nicotine dependence, cigarettes, uncomplicated: Secondary | ICD-10-CM | POA: Insufficient documentation

## 2015-09-22 DIAGNOSIS — R51 Headache: Secondary | ICD-10-CM | POA: Insufficient documentation

## 2015-09-22 DIAGNOSIS — R42 Dizziness and giddiness: Secondary | ICD-10-CM | POA: Insufficient documentation

## 2015-09-22 DIAGNOSIS — J449 Chronic obstructive pulmonary disease, unspecified: Secondary | ICD-10-CM | POA: Insufficient documentation

## 2015-09-22 NOTE — ED Notes (Signed)
Called to place in room, no answer.

## 2015-09-22 NOTE — ED Notes (Signed)
Pt reports was working yesterday and started having headache, difficulty speaking, and r hand tingling since yesterday.  Reports sharp pain behind both eyes.  Reports he works out of town and he had someone bring him home last night.

## 2015-09-22 NOTE — ED Notes (Signed)
Called patient to place in room. No answer. 

## 2015-11-13 ENCOUNTER — Emergency Department (HOSPITAL_COMMUNITY)
Admission: EM | Admit: 2015-11-13 | Discharge: 2015-11-13 | Disposition: A | Discharge status: Home / Self Care | Payer: Self-pay | Attending: Emergency Medicine | Admitting: Emergency Medicine

## 2015-11-13 ENCOUNTER — Encounter (HOSPITAL_COMMUNITY): Payer: Self-pay | Admitting: Emergency Medicine

## 2015-11-13 ENCOUNTER — Emergency Department (HOSPITAL_COMMUNITY): Payer: Self-pay

## 2015-11-13 DIAGNOSIS — R51 Headache: Secondary | ICD-10-CM | POA: Insufficient documentation

## 2015-11-13 DIAGNOSIS — H9201 Otalgia, right ear: Secondary | ICD-10-CM | POA: Insufficient documentation

## 2015-11-13 DIAGNOSIS — Z792 Long term (current) use of antibiotics: Secondary | ICD-10-CM | POA: Insufficient documentation

## 2015-11-13 DIAGNOSIS — Z7952 Long term (current) use of systemic steroids: Secondary | ICD-10-CM | POA: Insufficient documentation

## 2015-11-13 DIAGNOSIS — J189 Pneumonia, unspecified organism: Secondary | ICD-10-CM | POA: Insufficient documentation

## 2015-11-13 DIAGNOSIS — F17210 Nicotine dependence, cigarettes, uncomplicated: Secondary | ICD-10-CM | POA: Insufficient documentation

## 2015-11-13 DIAGNOSIS — H9211 Otorrhea, right ear: Secondary | ICD-10-CM | POA: Insufficient documentation

## 2015-11-13 DIAGNOSIS — Z8739 Personal history of other diseases of the musculoskeletal system and connective tissue: Secondary | ICD-10-CM | POA: Insufficient documentation

## 2015-11-13 DIAGNOSIS — G8929 Other chronic pain: Secondary | ICD-10-CM | POA: Insufficient documentation

## 2015-11-13 DIAGNOSIS — Z79899 Other long term (current) drug therapy: Secondary | ICD-10-CM | POA: Insufficient documentation

## 2015-11-13 DIAGNOSIS — Z791 Long term (current) use of non-steroidal anti-inflammatories (NSAID): Secondary | ICD-10-CM | POA: Insufficient documentation

## 2015-11-13 DIAGNOSIS — J441 Chronic obstructive pulmonary disease with (acute) exacerbation: Secondary | ICD-10-CM | POA: Insufficient documentation

## 2015-11-13 MED ORDER — AZITHROMYCIN 250 MG PO TABS
ORAL_TABLET | ORAL | Status: DC
Start: 2015-11-13 — End: 2015-11-25

## 2015-11-13 MED ORDER — CEFTRIAXONE SODIUM 1 G IJ SOLR
1.0000 g | Freq: Once | INTRAMUSCULAR | Status: AC
  Administered 2015-11-13: 1 g via INTRAMUSCULAR
  Filled 2015-11-13: qty 10

## 2015-11-13 MED ORDER — STERILE WATER FOR INJECTION IJ SOLN
INTRAMUSCULAR | Status: AC
  Administered 2015-11-13: 2.1 mL
  Filled 2015-11-13: qty 10

## 2015-11-13 MED ORDER — PREDNISONE 10 MG PO TABS: ORAL_TABLET | ORAL | Status: DC

## 2015-11-13 MED ORDER — PREDNISONE 50 MG PO TABS
60.0000 mg | ORAL_TABLET | Freq: Once | ORAL | Status: AC
  Administered 2015-11-13: 60 mg via ORAL
  Filled 2015-11-13: qty 1

## 2015-11-13 MED ORDER — AZITHROMYCIN 250 MG PO TABS
500.0000 mg | ORAL_TABLET | Freq: Once | ORAL | Status: AC
  Administered 2015-11-13: 500 mg via ORAL
  Filled 2015-11-13: qty 2

## 2015-11-13 MED ORDER — ALBUTEROL SULFATE (2.5 MG/3ML) 0.083% IN NEBU
2.5000 mg | INHALATION_SOLUTION | Freq: Four times a day (QID) | RESPIRATORY_TRACT | Status: DC | PRN
Start: 2015-11-13 — End: 2015-11-25

## 2015-11-13 NOTE — Discharge Instructions (Signed)

## 2015-11-13 NOTE — ED Provider Notes (Signed)
CSN: [id number]     Arrival date & time 11/13/15  1347 History  By signing my name below, I, Placido SouLogan Joldersma, attest that this documentation has been prepared under the direction and in the presence of Cheron SchaumannLeslie Sofia, PlainsPA-C. Electronically Signed: Placido SouLogan Joldersma, ED Scribe. 11/13/2015. 3:25 PM.    Chief Complaint  Patient presents with  . Cough   HPI  HPI Comments: Alanson AlyHenry T Finlay is a 50 y.o. male with a hx of COPD who presents to the Emergency Department complaining of worsening, moderate, SOB with onset 3.5 weeks ago. He notes associated productive cough, chest congestion, right ear pain, right ear drainage and a moderate HA. He notes taking OTC cold medications and using his albuterol inhaler 2x per day with no relief. Pt also notes having a nebulizer at home and denies it has provided relief. Pt notes a hx of pneumonia and URI further noting he was admitted for pneumonia about 1.5 years ago. He denies any other associated symptoms at this time.   PCP: None   Past Medical History  Diagnosis Date  . Pneumonia   . Chronic back pain   . COPD (chronic obstructive pulmonary disease) (HCC)   . DDD (degenerative disc disease), cervical   . Sciatic pain    History reviewed. No pertinent past surgical history. No family history on file. Social History  Substance Use Topics  . Smoking status: Current Every Day Smoker -- 1.00 packs/day    Types: Cigarettes  . Smokeless tobacco: None  . Alcohol Use: No    Review of Systems  HENT: Positive for congestion and ear discharge.   All other systems reviewed and are negative.   Allergies  Review of patient's allergies indicates no known allergies.  Home Medications   Prior to Admission medications   Medication Sig Start Date End Date Taking? Authorizing Provider  amoxicillin (AMOXIL) 500 MG capsule Take 1 capsule (500 mg total) by mouth 3 (three) times daily. 02/22/14   Tammy Triplett, PA-C  cyclobenzaprine (FLEXERIL) 10 MG tablet Take 1  tablet (10 mg total) by mouth 2 (two) times daily as needed for muscle spasms. 05/16/14   Hope Orlene OchM Neese, NP  doxycycline (VIBRAMYCIN) 100 MG capsule Take 1 capsule (100 mg total) by mouth 2 (two) times daily. One po bid x 7 days 02/14/15   Mancel BaleElliott [place], MD  HYDROcodone-acetaminophen (NORCO/VICODIN) 5-325 MG per tablet Take one-two tabs po q 4-6 hrs prn pain 02/22/14   Tammy Triplett, PA-C  HYDROcodone-acetaminophen (NORCO/VICODIN) 5-325 MG per tablet Take 1 tablet by mouth every 4 (four) hours as needed. 05/26/14   Burgess AmorJulie Idol, PA-C  hydrocortisone (ANUSOL-HC) 2.5 % rectal cream Apply rectally 2 times daily 02/22/14   Tammy Triplett, PA-C  ibuprofen (ADVIL,MOTRIN) 200 MG tablet Take 1,000-1,200 mg by mouth 4 (four) times daily as needed for moderate pain.     Historical Provider, MD  meloxicam (MOBIC) 7.5 MG tablet Take 1 tablet (7.5 mg total) by mouth daily. 05/16/14   Hope Orlene OchM Neese, NP  predniSONE (DELTASONE) 20 MG tablet Take 1 tablet (20 mg total) by mouth 2 (two) times daily. 02/14/15   Mancel BaleElliott [place], MD   BP 122/88 mmHg  Pulse 75  Temp(Src) 97.9 F (36.6 C) (Oral)  Resp 18  Ht 6' (1.829 m)  Wt 135 lb (61.236 kg)  BMI 18.31 kg/m2  SpO2 97%    Physical Exam  Constitutional: He is oriented to person, place, and time. He appears well-developed and well-nourished.  HENT:  Head:  Normocephalic and atraumatic.  Right Ear: Tympanic membrane normal.  Left Ear: Tympanic membrane normal.  Mouth/Throat: No oropharyngeal exudate.  Neck: Normal range of motion. No tracheal deviation present.  Cardiovascular: Normal rate.   Pulmonary/Chest: Effort normal. No respiratory distress.  Bilateral rhonchi   Abdominal: Soft. There is no tenderness.  Musculoskeletal: Normal range of motion.  Neurological: He is alert and oriented to person, place, and time.  Skin: Skin is warm and dry. He is not diaphoretic.  Psychiatric: He has a normal mood and affect. His behavior is normal.  Nursing note and vitals  reviewed.   ED Course  Procedures  DIAGNOSTIC STUDIES: Oxygen Saturation is 97% on RA, normal by my interpretation.    COORDINATION OF CARE: 3:24 PM Pt presents today due to SOB. Discussed next steps for treatment and he agreed to the plan.   Labs Review Labs Reviewed - No data to display  Imaging Review Dg Chest 2 View  11/13/2015  CLINICAL DATA:  Patient with cough and shortness of breath. EXAM: CHEST  2 VIEW COMPARISON:  Chest radiograph 02/13/2015 FINDINGS: Stable cardiac and mediastinal contours. Lungs are hyperinflated. Minimal heterogeneous opacities right lung base. No pleural effusion pneumothorax. Thoracic spine degenerative changes. IMPRESSION: Heterogeneous opacities right lung base may represent atelectasis or infection. Pulmonary hyperinflation. Electronically Signed   By: Annia Belt M.D.   On: 11/13/2015 14:51   I have personally reviewed and evaluated these images as part of my medical decision-making.   EKG Interpretation None      MDM   Final diagnoses:  Community acquired pneumonia    Pt given rocephin Im, zithromax and prednisone. [facility]   Pt advised he needs repeat chest xray.    Lonia Skinner [place], PA-C 11/13/15 1541  Donnetta Hutching, MD 11/14/15 [phone]

## 2015-11-13 NOTE — ED Notes (Addendum)
Pt reports SOB, cough, chest congestion, HA, and LT ear pain x 1 month. Reports OTC medications with no relief. Hx of COPD.

## 2015-11-25 ENCOUNTER — Encounter (HOSPITAL_COMMUNITY): Payer: Self-pay | Admitting: *Deleted

## 2015-11-25 ENCOUNTER — Emergency Department (HOSPITAL_COMMUNITY): Payer: Self-pay

## 2015-11-25 ENCOUNTER — Emergency Department (HOSPITAL_COMMUNITY)
Admission: EM | Admit: 2015-11-25 | Discharge: 2015-11-25 | Disposition: A | Discharge status: Home / Self Care | Payer: Self-pay | Attending: Emergency Medicine | Admitting: Emergency Medicine

## 2015-11-25 DIAGNOSIS — Z792 Long term (current) use of antibiotics: Secondary | ICD-10-CM | POA: Insufficient documentation

## 2015-11-25 DIAGNOSIS — J40 Bronchitis, not specified as acute or chronic: Secondary | ICD-10-CM

## 2015-11-25 DIAGNOSIS — Z791 Long term (current) use of non-steroidal anti-inflammatories (NSAID): Secondary | ICD-10-CM | POA: Insufficient documentation

## 2015-11-25 DIAGNOSIS — Z79899 Other long term (current) drug therapy: Secondary | ICD-10-CM | POA: Insufficient documentation

## 2015-11-25 DIAGNOSIS — Z7952 Long term (current) use of systemic steroids: Secondary | ICD-10-CM | POA: Insufficient documentation

## 2015-11-25 DIAGNOSIS — Z8701 Personal history of pneumonia (recurrent): Secondary | ICD-10-CM | POA: Insufficient documentation

## 2015-11-25 DIAGNOSIS — J441 Chronic obstructive pulmonary disease with (acute) exacerbation: Secondary | ICD-10-CM | POA: Insufficient documentation

## 2015-11-25 DIAGNOSIS — G8929 Other chronic pain: Secondary | ICD-10-CM | POA: Insufficient documentation

## 2015-11-25 DIAGNOSIS — M5430 Sciatica, unspecified side: Secondary | ICD-10-CM | POA: Insufficient documentation

## 2015-11-25 DIAGNOSIS — F17210 Nicotine dependence, cigarettes, uncomplicated: Secondary | ICD-10-CM | POA: Insufficient documentation

## 2015-11-25 MED ORDER — ALBUTEROL SULFATE HFA 108 (90 BASE) MCG/ACT IN AERS
2.0000 | INHALATION_SPRAY | Freq: Four times a day (QID) | RESPIRATORY_TRACT | Status: DC
  Administered 2015-11-25: 2 via RESPIRATORY_TRACT
  Filled 2015-11-25: qty 6.7

## 2015-11-25 MED ORDER — PREDNISONE 20 MG PO TABS: 60.0000 mg | ORAL_TABLET | Freq: Every day | ORAL | Status: AC

## 2015-11-25 MED ORDER — KETOROLAC TROMETHAMINE 30 MG/ML IJ SOLN
30.0000 mg | Freq: Once | INTRAMUSCULAR | Status: AC
  Administered 2015-11-25: 30 mg via INTRAMUSCULAR
  Filled 2015-11-25: qty 1

## 2015-11-25 MED ORDER — DEXAMETHASONE SODIUM PHOSPHATE 10 MG/ML IJ SOLN
10.0000 mg | Freq: Once | INTRAMUSCULAR | Status: AC
  Administered 2015-11-25: 10 mg via INTRAMUSCULAR
  Filled 2015-11-25: qty 1

## 2015-11-25 NOTE — ED Notes (Signed)
Awaiting RT for discharge. 

## 2015-11-25 NOTE — ED Provider Notes (Signed)
CSN: [id number]     Arrival date & time 11/25/15  1034 History  By signing my name below, I, Gonzella Lex, attest that this documentation has been prepared under the direction and in the presence of Gerhard Munch, MD. Electronically Signed: Gonzella Lex, Scribe. 11/25/2015. 12:17 PM.   Chief Complaint  Patient presents with  . Pneumonia   The history is provided by the patient. No language interpreter was used.    HPI Comments: William Steele is a 50 y.o. male with a hx of sciatica and chronic back pain who presents to the Emergency Department complaining of coughing, SOB, and back pain onset two weeks ago with associated lightheadedness. He notes that he was diagnosed with PNA recently but has not been able to get his medication refilled because he currently is not employed and is financially strained. Pt denies syncope and vomiting. He reports that he smokes about two packs of cigarettes per day.   Past Medical History  Diagnosis Date  . Pneumonia   . Chronic back pain   . COPD (chronic obstructive pulmonary disease) (HCC)   . DDD (degenerative disc disease), cervical   . Sciatic pain    History reviewed. No pertinent past surgical history. No family history on file. Social History  Substance Use Topics  . Smoking status: Current Every Day Smoker -- 1.00 packs/day    Types: Cigarettes  . Smokeless tobacco: None  . Alcohol Use: No    Review of Systems  Constitutional:       Per HPI, otherwise negative  HENT:       R ear discomfort  Respiratory:       Per HPI, otherwise negative  Cardiovascular:       Per HPI, otherwise negative  Gastrointestinal: Negative for vomiting.  Endocrine:       Negative aside from HPI  Genitourinary:       Neg aside from HPI   Musculoskeletal:       Per HPI, otherwise negative  Skin: Negative.   Neurological: Negative for syncope.      Allergies  Review of patient's allergies indicates no known allergies.  Home  Medications   Prior to Admission medications   Medication Sig Start Date End Date Taking? Authorizing Provider  albuterol (PROVENTIL) (2.5 MG/3ML) 0.083% nebulizer solution Take 3 mLs (2.5 mg total) by nebulization every 6 (six) hours as needed for wheezing or shortness of breath. 11/13/15   Elson Areas, PA-C  amoxicillin (AMOXIL) 500 MG capsule Take 1 capsule (500 mg total) by mouth 3 (three) times daily. 02/22/14   Tammy Triplett, PA-C  azithromycin (ZITHROMAX) 250 MG tablet One tablet a day starting 1/2 11/13/15   Elson Areas, PA-C  cyclobenzaprine (FLEXERIL) 10 MG tablet Take 1 tablet (10 mg total) by mouth 2 (two) times daily as needed for muscle spasms. 05/16/14   Hope Orlene Och, NP  doxycycline (VIBRAMYCIN) 100 MG capsule Take 1 capsule (100 mg total) by mouth 2 (two) times daily. One po bid x 7 days 02/14/15   Mancel Bale, MD  HYDROcodone-acetaminophen (NORCO/VICODIN) 5-325 MG per tablet Take one-two tabs po q 4-6 hrs prn pain 02/22/14   Tammy Triplett, PA-C  HYDROcodone-acetaminophen (NORCO/VICODIN) 5-325 MG per tablet Take 1 tablet by mouth every 4 (four) hours as needed. 05/26/14   Burgess Amor, PA-C  hydrocortisone (ANUSOL-HC) 2.5 % rectal cream Apply rectally 2 times daily 02/22/14   Tammy Triplett, PA-C  ibuprofen (ADVIL,MOTRIN) 200 MG tablet Take 1,000-1,200  mg by mouth 4 (four) times daily as needed for moderate pain.     Historical Provider, MD  meloxicam (MOBIC) 7.5 MG tablet Take 1 tablet (7.5 mg total) by mouth daily. 05/16/14   Hope Orlene Och, NP  predniSONE (DELTASONE) 10 MG tablet 5,4,3,2,1 taper 11/13/15   Lonia Skinner Sofia, PA-C   BP 119/76 mmHg  Pulse 71  Temp(Src) 98.1 F (36.7 C) (Oral)  Resp 18  Ht 6' (1.829 m)  Wt 135 lb (61.236 kg)  BMI 18.31 kg/m2  SpO2 100% Physical Exam  Constitutional: He is oriented to person, place, and time. He has a sickly appearance. No distress.  HENT:  Head: Normocephalic and atraumatic.  Right Ear: Tympanic membrane normal.  Left Ear: Tympanic  membrane normal.  Eyes: Conjunctivae and EOM are normal.  Cardiovascular: Normal rate and regular rhythm.   Pulmonary/Chest: Effort normal. No stridor. No respiratory distress. He has decreased breath sounds. He has no wheezes.  Abdominal: He exhibits no distension.  Musculoskeletal: He exhibits no edema.  Neurological: He is oriented to person, place, and time.  Skin: Skin is warm and dry.  Psychiatric: He has a normal mood and affect.  Nursing note and vitals reviewed.   ED Course  Procedures  DIAGNOSTIC STUDIES:    Oxygen Saturation is 100% on RA, normal by my interpretation.   COORDINATION OF CARE:  12:07 PM Will administer pt shot in the ED and will prescribe pt medication. Will administer pt breathing treatment in the ED. Discussed treatment plan with pt at bedside and pt agreed to plan.   Imaging Review Dg Chest 2 View  11/25/2015  CLINICAL DATA:  Two week history of nonproductive cough, shortness of breath and chest pain. EXAM: CHEST  2 VIEW COMPARISON:  11/13/2015 FINDINGS: The cardiac silhouette, mediastinal and hilar contours are within normal limits and stable. Mild tortuosity and calcification of the thoracic aorta. Hyperinflation and early emphysematous changes. Chronic appearing bronchitic changes likely related to smoking. No definite infiltrates or effusions. Accessory fissure noted in the right upper lobe. The bony thorax is intact. IMPRESSION: Emphysematous changes and chronic bronchitic changes, likely related to smoking. No acute pulmonary findings. Electronically Signed   By: Rudie Meyer M.D.   On: 11/25/2015 11:00   I have personally reviewed and evaluated these images and lab results as part of my medical decision-making. In comparison to x-ray from several days ago, there is no clear atelectasis. No clear infiltrate.  EMR reviewed notable for evaluation several days ago, with findings concerning for atelectasis versus pneumonia. Notably, the patient has not  obtained antibiotics since discharge, but continues to have no fever, and x-ray today appears somewhat,/better. No evidence for pneumonia.  Smoking cessation provided, particularly in light of this patient's evaluation in the ED.' MDM   Final diagnoses:  Bronchitis    I personally performed the services described in this documentation, which was scribed in my presence. The recorded information has been reviewed and is accurate.    This 50 year old male presents with ongoing cough, congestion. Notably, the patient continues to smoke 2 packs daily. Patient seen here several days ago, found have atelectatic changes on x-ray, and did not obtained antibiotics on discharge. Here, with no progression, no fever, no tachycardia, there is low suspicion for occult pneumonia. Patient likely has bronchitis. After counseling on smoking cessation, provision of intramuscular steroids, and inflammatory here, and provision of an albuterol inhaler, the patient was discharged with continued steroids, and referral to local primary care.  Gerhard Munchobert Isaia Hassell, MD 11/25/15 [phone]

## 2015-11-25 NOTE — Discharge Instructions (Signed)
As discussed, the single most important thing you can do to improve your condition is to stop smoking.  You have been prescribed additional medication, please be sure to obtain this, use this as directed.  Please use the provided albuterol inhaler every 4 hours for the next 2 days.   How to Use an Inhaler Proper inhaler technique is very important. Good technique ensures that the medicine reaches the lungs. Poor technique results in depositing the medicine on the tongue and back of the throat rather than in the airways. If you do not use the inhaler with good technique, the medicine will not help you. STEPS TO FOLLOW IF USING AN INHALER WITHOUT AN EXTENSION TUBE 1. Remove the cap from the inhaler. 2. If you are using the inhaler for the first time, you will need to prime it. Shake the inhaler for 5 seconds and release four puffs into the air, away from your face. Ask your health care provider or pharmacist if you have questions about priming your inhaler. 3. Shake the inhaler for 5 seconds before each breath in (inhalation). 4. Position the inhaler so that the top of the canister faces up. 5. Put your index finger on the top of the medicine canister. Your thumb supports the bottom of the inhaler. 6. Open your mouth. 7. Either place the inhaler between your teeth and place your lips tightly around the mouthpiece, or hold the inhaler 1-2 inches away from your open mouth. If you are unsure of which technique to use, ask your health care provider. 8. Breathe out (exhale) normally and as completely as possible. 9. Press the canister down with your index finger to release the medicine. 10. At the same time as the canister is pressed, inhale deeply and slowly until your lungs are completely filled. This should take 4-6 seconds. Keep your tongue down. 11. Hold the medicine in your lungs for 5-10 seconds (10 seconds is best). This helps the medicine get into the small airways of your lungs. 12. Breathe  out slowly, through pursed lips. Whistling is an example of pursed lips. 13. Wait at least 15-30 seconds between puffs. Continue with the above steps until you have taken the number of puffs your health care provider has ordered. Do not use the inhaler more than your health care provider tells you. 14. Replace the cap on the inhaler. 15. Follow the directions from your health care provider or the inhaler insert for cleaning the inhaler. STEPS TO FOLLOW IF USING AN INHALER WITH AN EXTENSION (SPACER) 1. Remove the cap from the inhaler. 2. If you are using the inhaler for the first time, you will need to prime it. Shake the inhaler for 5 seconds and release four puffs into the air, away from your face. Ask your health care provider or pharmacist if you have questions about priming your inhaler. 3. Shake the inhaler for 5 seconds before each breath in (inhalation). 4. Place the open end of the spacer onto the mouthpiece of the inhaler. 5. Position the inhaler so that the top of the canister faces up and the spacer mouthpiece faces you. 6. Put your index finger on the top of the medicine canister. Your thumb supports the bottom of the inhaler and the spacer. 7. Breathe out (exhale) normally and as completely as possible. 8. Immediately after exhaling, place the spacer between your teeth and into your mouth. Close your lips tightly around the spacer. 9. Press the canister down with your index finger to release the  medicine. 10. At the same time as the canister is pressed, inhale deeply and slowly until your lungs are completely filled. This should take 4-6 seconds. Keep your tongue down and out of the way. 11. Hold the medicine in your lungs for 5-10 seconds (10 seconds is best). This helps the medicine get into the small airways of your lungs. Exhale. 12. Repeat inhaling deeply through the spacer mouthpiece. Again hold that breath for up to 10 seconds (10 seconds is best). Exhale slowly. If it is  difficult to take this second deep breath through the spacer, breathe normally several times through the spacer. Remove the spacer from your mouth. 13. Wait at least 15-30 seconds between puffs. Continue with the above steps until you have taken the number of puffs your health care provider has ordered. Do not use the inhaler more than your health care provider tells you. 14. Remove the spacer from the inhaler, and place the cap on the inhaler. 15. Follow the directions from your health care provider or the inhaler insert for cleaning the inhaler and spacer. If you are using different kinds of inhalers, use your quick relief medicine to open the airways 10-15 minutes before using a steroid if instructed to do so by your health care provider. If you are unsure which inhalers to use and the order of using them, ask your health care provider, nurse, or respiratory therapist. If you are using a steroid inhaler, always rinse your mouth with water after your last puff, then gargle and spit out the water. Do not swallow the water. AVOID:  Inhaling before or after starting the spray of medicine. It takes practice to coordinate your breathing with triggering the spray.  Inhaling through the nose (rather than the mouth) when triggering the spray. HOW TO DETERMINE IF YOUR INHALER IS FULL OR NEARLY EMPTY You cannot know when an inhaler is empty by shaking it. A few inhalers are now being made with dose counters. Ask your health care provider for a prescription that has a dose counter if you feel you need that extra help. If your inhaler does not have a counter, ask your health care provider to help you determine the date you need to refill your inhaler. Write the refill date on a calendar or your inhaler canister. Refill your inhaler 7-10 days before it runs out. Be sure to keep an adequate supply of medicine. This includes making sure it is not expired, and that you have a spare inhaler.  SEEK MEDICAL CARE IF:    Your symptoms are only partially relieved with your inhaler.  You are having trouble using your inhaler.  You have some increase in phlegm. SEEK IMMEDIATE MEDICAL CARE IF:   You feel little or no relief with your inhalers. You are still wheezing and are feeling shortness of breath or tightness in your chest or both.  You have dizziness, headaches, or a fast heart rate.  You have chills, fever, or night sweats.  You have a noticeable increase in phlegm production, or there is blood in the phlegm. MAKE SURE YOU:   Understand these instructions.  Will watch your condition.  Will get help right away if you are not doing well or get worse.   This information is not intended to replace advice given to you by your health care provider. Make sure you discuss any questions you have with your health care provider.   Document Released: 10/26/2000 Document Revised: 08/19/2013 Document Reviewed: 05/28/2013 Elsevier Interactive Patient Education  2016 Elsevier Inc. ° °

## 2015-11-25 NOTE — ED Notes (Signed)
Pt states he was here two weeks ago and dx with pneumonia but was unable to have medications filled. Pt states non-productive cough, no known fever. States severe lower back pain.

## 2017-03-15 IMAGING — DX DG CHEST 2V
2 series · 2 of 2 positions shown · non-contrast
Comparison: Chest radiograph 02/13/2015

CLINICAL DATA: Patient with cough and shortness of breath.

EXAM:
CHEST  2 VIEW

[chest pa]
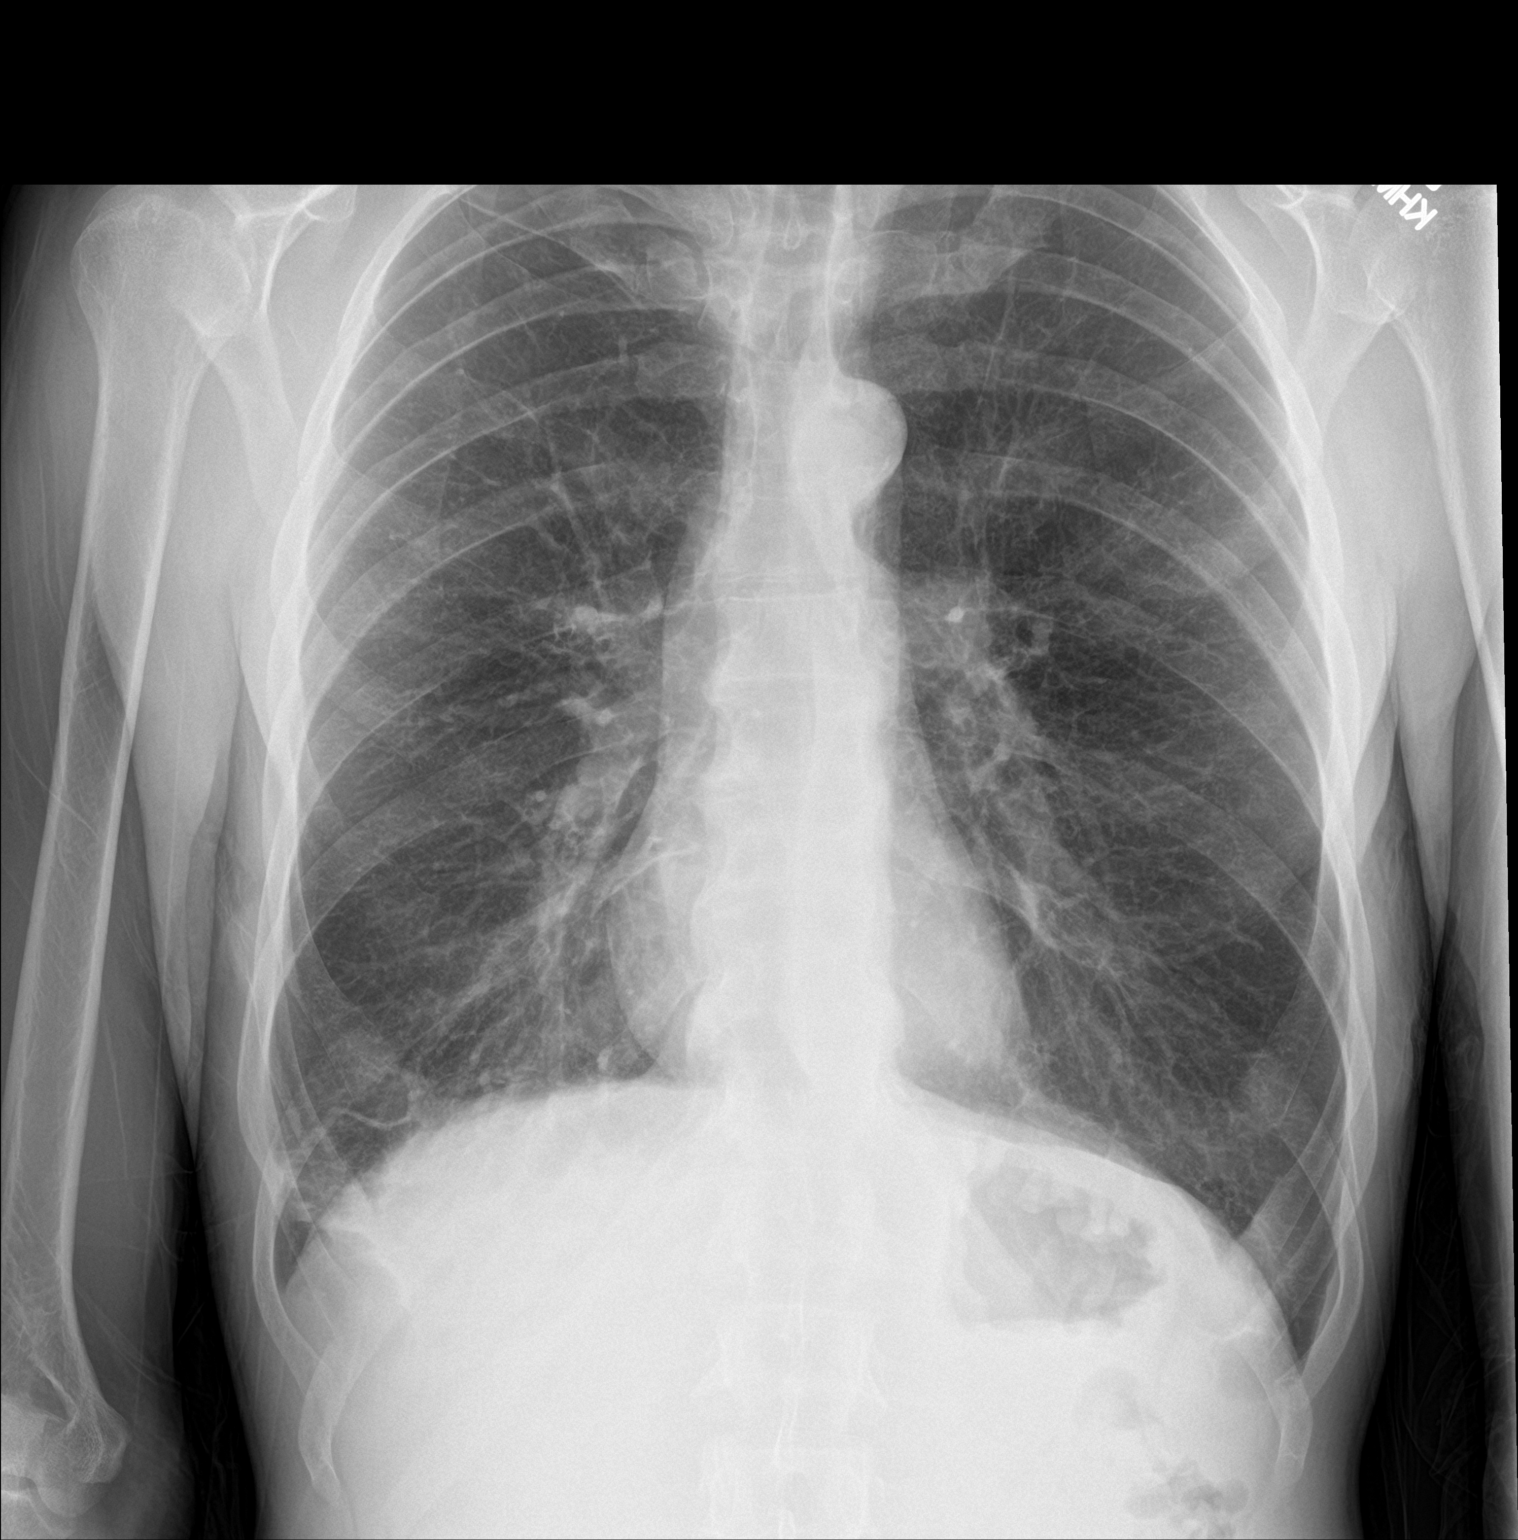

[chest lat]
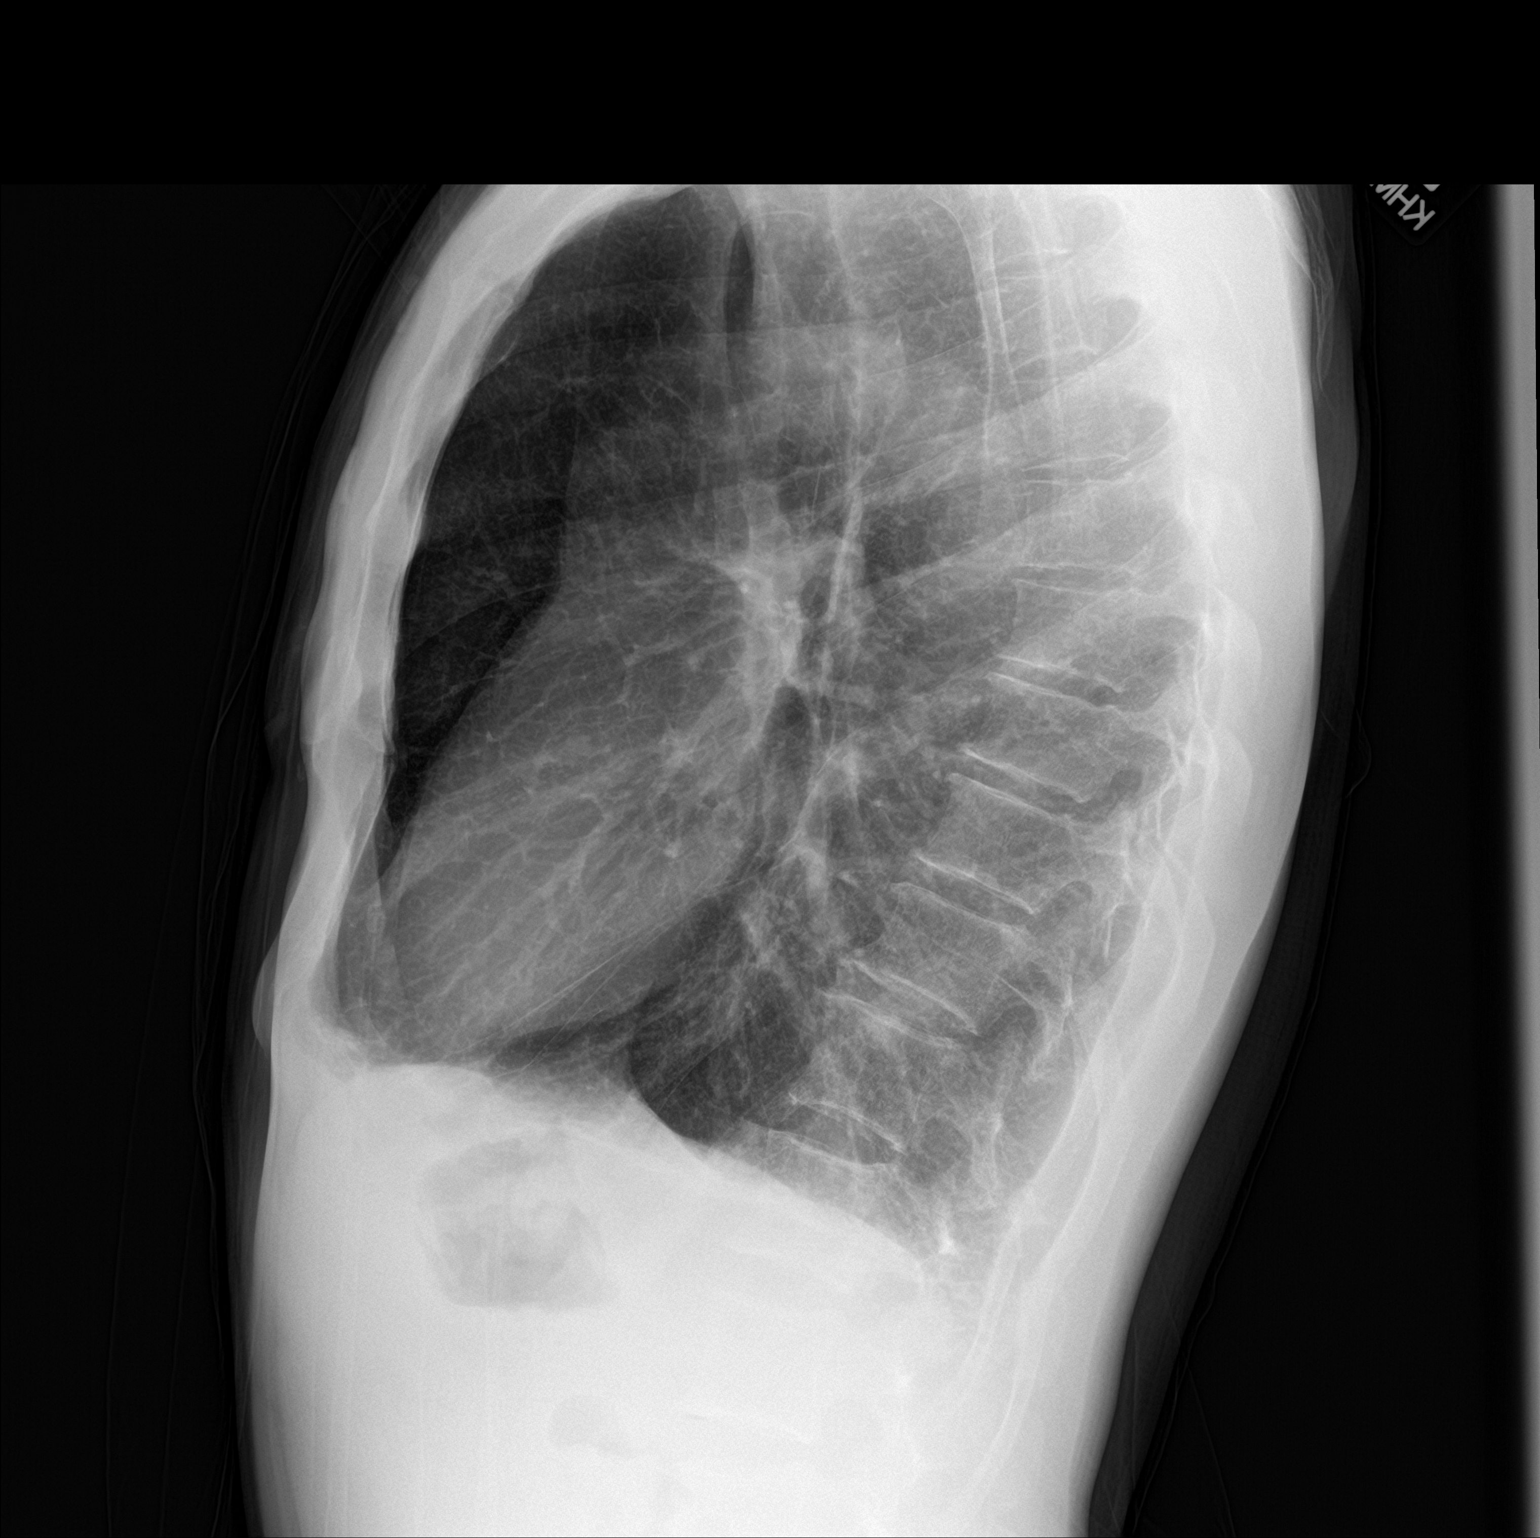

[2 of 2 positions shown; findings below may reference images not displayed]

FINDINGS: Stable cardiac and mediastinal contours. Lungs are hyperinflated.
Minimal heterogeneous opacities right lung base. No pleural effusion
pneumothorax. Thoracic spine degenerative changes.
IMPRESSION: Heterogeneous opacities right lung base may represent atelectasis or
infection.

Pulmonary hyperinflation.

## 2018-10-14 ENCOUNTER — Encounter (HOSPITAL_COMMUNITY): Payer: Self-pay | Admitting: Emergency Medicine

## 2018-10-14 ENCOUNTER — Emergency Department (HOSPITAL_COMMUNITY)
Admission: EM | Admit: 2018-10-14 | Discharge: 2018-10-14 | Disposition: A | Discharge status: Home / Self Care | Payer: Self-pay | Attending: Emergency Medicine | Admitting: Emergency Medicine

## 2018-10-14 ENCOUNTER — Emergency Department (HOSPITAL_COMMUNITY): Payer: Self-pay

## 2018-10-14 DIAGNOSIS — F17210 Nicotine dependence, cigarettes, uncomplicated: Secondary | ICD-10-CM | POA: Insufficient documentation

## 2018-10-14 DIAGNOSIS — J181 Lobar pneumonia, unspecified organism: Secondary | ICD-10-CM

## 2018-10-14 DIAGNOSIS — Z79899 Other long term (current) drug therapy: Secondary | ICD-10-CM | POA: Insufficient documentation

## 2018-10-14 DIAGNOSIS — J449 Chronic obstructive pulmonary disease, unspecified: Secondary | ICD-10-CM | POA: Insufficient documentation

## 2018-10-14 DIAGNOSIS — J189 Pneumonia, unspecified organism: Secondary | ICD-10-CM | POA: Insufficient documentation

## 2018-10-14 LAB — BASIC METABOLIC PANEL
Anion gap: 5 (ref 5–15)
BUN: 16 mg/dL (ref 6–20)
CHLORIDE: 109 mmol/L (ref 98–111)
CO2: 26 mmol/L (ref 22–32)
Calcium: 8.8 mg/dL — ABNORMAL LOW (ref 8.9–10.3)
Creatinine, Ser: 1.14 mg/dL (ref 0.61–1.24)
GFR calc non Af Amer: >60 mL/min (ref >60)
Glucose, Bld: 69 mg/dL — ABNORMAL LOW (ref 70–99)
Potassium: 3.7 mmol/L (ref 3.5–5.1)
Sodium: 140 mmol/L (ref 135–145)

## 2018-10-14 LAB — CBC WITH DIFFERENTIAL/PLATELET
ABS IMMATURE GRANULOCYTES: 0.02 10*3/uL (ref 0.00–0.07)
BASOS ABS: 0 10*3/uL (ref 0.0–0.1)
Basophils Relative: 1 %
Eosinophils Absolute: 0.1 10*3/uL (ref 0.0–0.5)
Eosinophils Relative: 1 %
HEMATOCRIT: 48.2 % (ref 39.0–52.0)
HEMOGLOBIN: 15.2 g/dL (ref 13.0–17.0)
Immature Granulocytes: 0 %
LYMPHS ABS: 2.6 10*3/uL (ref 0.7–4.0)
LYMPHS PCT: 41 %
MCH: 28.7 pg (ref 26.0–34.0)
MCHC: 31.5 g/dL (ref 30.0–36.0)
MCV: 91.1 fL (ref 80.0–100.0)
Monocytes Absolute: 0.6 10*3/uL (ref 0.1–1.0)
Monocytes Relative: 9 %
NEUTROS ABS: 3 10*3/uL (ref 1.7–7.7)
Neutrophils Relative %: 48 %
Platelets: 256 10*3/uL (ref 150–400)
RBC: 5.29 MIL/uL (ref 4.22–5.81)
RDW: 14.9 % (ref 11.5–15.5)
WBC: 6.3 10*3/uL (ref 4.0–10.5)
nRBC: 0 % (ref 0.0–0.2)

## 2018-10-14 MED ORDER — DOXYCYCLINE HYCLATE 100 MG PO TABS
100.0000 mg | ORAL_TABLET | Freq: Once | ORAL | Status: AC
  Administered 2018-10-14: 100 mg via ORAL
  Filled 2018-10-14: qty 1

## 2018-10-14 MED ORDER — ALBUTEROL SULFATE HFA 108 (90 BASE) MCG/ACT IN AERS
2.0000 | INHALATION_SPRAY | Freq: Four times a day (QID) | RESPIRATORY_TRACT | Status: DC
  Filled 2018-10-14: qty 6.7

## 2018-10-14 MED ORDER — ALBUTEROL SULFATE (2.5 MG/3ML) 0.083% IN NEBU: 2.5000 mg | INHALATION_SOLUTION | Freq: Four times a day (QID) | RESPIRATORY_TRACT | 0 refills | Status: DC | PRN

## 2018-10-14 MED ORDER — PREDNISONE 10 MG PO TABS
60.0000 mg | ORAL_TABLET | ORAL | Status: AC
  Administered 2018-10-14: 60 mg via ORAL
  Filled 2018-10-14: qty 1

## 2018-10-14 MED ORDER — PREDNISONE 20 MG PO TABS: 40.0000 mg | ORAL_TABLET | Freq: Every day | ORAL | 0 refills | Status: DC

## 2018-10-14 MED ORDER — DOXYCYCLINE HYCLATE 100 MG PO CAPS: 100.0000 mg | ORAL_CAPSULE | Freq: Two times a day (BID) | ORAL | 0 refills | Status: DC

## 2018-10-14 MED ORDER — DOXYCYCLINE HYCLATE 100 MG PO CAPS: 100.0000 mg | ORAL_CAPSULE | Freq: Two times a day (BID) | ORAL | 0 refills | Status: AC

## 2018-10-14 NOTE — ED Triage Notes (Signed)
Pt c/o nonproductive cough and overall weakness x 2 weeks.

## 2018-10-14 NOTE — Discharge Instructions (Signed)
As discussed, the most important thing to improve your medical condition is to stop smoking. For this illness, pneumonia, please take all medication as directed, and [facility]. They should be able to help you connect with a primary care physician for ongoing care.  Return here for concerning changes in your condition.

## 2018-10-14 NOTE — ED Provider Notes (Signed)
[facility] EMERGENCY DEPARTMENT Provider Note   CSN: [id number] Arrival date & time: 10/14/18  4098     History   Chief Complaint Chief Complaint  Patient presents with  . Cough    HPI William Steele is a 52 y.o. male.  HPI Patient presents with concern of cough, fatigue. Patient is a smoker, has COPD He continues to smoke cigarettes, notes that he smokes 2 packs daily, has done so for 37 years. With past 2 weeks he has developed worsening cough, fatigue, without appreciable fever. No relief in spite of using albuterol, other OTC medication. No chest pain, no abdominal pain, no syncope, no vomiting, no diarrhea.  Past Medical History:  Diagnosis Date  . Chronic back pain   . COPD (chronic obstructive pulmonary disease) (HCC)   . DDD (degenerative disc disease), cervical   . Pneumonia   . Sciatic pain     There are no active problems to display for this patient.   History reviewed. No pertinent surgical history.      Home Medications    Prior to Admission medications   Medication Sig Start Date End Date Taking? Authorizing Provider  ibuprofen (ADVIL,MOTRIN) 200 MG tablet Take 1,000-1,200 mg by mouth 4 (four) times daily as needed for moderate pain.     [provider]  albuterol (PROVENTIL) (2.5 MG/3ML) 0.083% nebulizer solution Take 3 mLs (2.5 mg total) by nebulization every 6 (six) hours as needed for wheezing or shortness of breath. 11/13/15 11/25/15  Elson Areas, PA-C    Family History History reviewed. No pertinent family history.  Social History Social History   Tobacco Use  . Smoking status: Current Every Day Smoker    Packs/day: 1.00    Types: Cigarettes  . Smokeless tobacco: Never Used  Substance Use Topics  . Alcohol use: No  . Drug use: No     Allergies   Patient has no known allergies.   Review of Systems Review of Systems  Constitutional:       Per HPI, otherwise negative  HENT:       Per HPI, otherwise negative    Respiratory:       Per HPI, otherwise negative  Cardiovascular:       Per HPI, otherwise negative  Gastrointestinal: Negative for vomiting.  Endocrine:       Negative aside from HPI  Genitourinary:       Neg aside from HPI   Musculoskeletal:       Per HPI, otherwise negative  Skin: Negative.   Neurological: Positive for weakness. Negative for syncope.     Physical Exam Updated Vital Signs BP 113/78 (BP Location: Right Arm)   Pulse 66   Temp 98.5 F (36.9 C) (Oral)   Resp 18   Ht 6' (1.829 m)   Wt 59 kg   SpO2 100%   BMI 17.63 kg/m   Physical Exam  Constitutional: He is oriented to person, place, and time. He appears well-developed. He has a sickly appearance. No distress.  HENT:  Head: Normocephalic and atraumatic.  Eyes: Conjunctivae and EOM are normal.  Cardiovascular: Normal rate and regular rhythm.  Pulmonary/Chest: No stridor. He has decreased breath sounds.  Abdominal: He exhibits no distension.  Musculoskeletal: He exhibits no edema.  Neurological: He is alert and oriented to person, place, and time.  Skin: Skin is warm and dry.  Psychiatric: He has a normal mood and affect.  Nursing note and vitals reviewed.    ED Treatments /  Results  Labs (all labs ordered are listed, but only abnormal results are displayed) Labs Reviewed  CBC WITH DIFFERENTIAL/PLATELET  BASIC METABOLIC PANEL    Radiology Dg Chest 2 View  Result Date: 10/14/2018 CLINICAL DATA:  Constant cough, worse in the last couple of days. Shortness of breath and back pain. EXAM: CHEST - 2 VIEW COMPARISON:  October 07, 2018 FINDINGS: There is a bulla in the right upper lobe, confirmed on previous CT imaging. Emphysematous changes in the lungs. Focal opacity is seen in the lateral posterior right lung base. No other acute abnormalities noted. IMPRESSION: There is focal opacity/infiltrate in the posterolateral right lung base, new since November 2019, suggesting the possibility of pneumonia.  Recommend short-term follow-up to ensure resolution. Electronically Signed   By: Gerome Samavid  Williams III M.D   On: 10/14/2018 19:46    Procedures Procedures (including critical care time)  Medications Ordered in ED Medications  albuterol (PROVENTIL HFA;VENTOLIN HFA) 108 (90 Base) MCG/ACT inhaler 2 puff (has no administration in time range)  doxycycline (VIBRA-TABS) tablet 100 mg (100 mg Oral Given 10/14/18 2031)  predniSONE (DELTASONE) tablet 60 mg (60 mg Oral Given 10/14/18 2032)     Initial Impression / Assessment and Plan / ED Course  I have reviewed the triage vital signs and the nursing notes.  Pertinent labs & imaging results that were available during my care of the patient were reviewed by me and considered in my medical decision making (see chart for details).  After the initial evaluation I reviewed the x-ray, and demonstrated to the patient. We reviewed prior x-rays as well. Patient is awake, alert, no hypoxia, no substantial increased work of breathing, appropriate for outpatient therapy for community acquired pneumonia. Patient also encouraged to pursue smoking cessation. Final Clinical Impressions(s) / ED Diagnoses  Community acquired pneumonia   Gerhard MunchLockwood, Zorianna Taliaferro, MD 10/14/18 2035

## 2019-09-05 ENCOUNTER — Emergency Department (HOSPITAL_COMMUNITY): Payer: Self-pay

## 2019-09-05 ENCOUNTER — Encounter (HOSPITAL_COMMUNITY): Payer: Self-pay | Admitting: Emergency Medicine

## 2019-09-05 ENCOUNTER — Other Ambulatory Visit: Payer: Self-pay

## 2019-09-05 ENCOUNTER — Emergency Department (HOSPITAL_COMMUNITY)
Admission: EM | Admit: 2019-09-05 | Discharge: 2019-09-05 | Discharge status: Against Medical Advice | Payer: Self-pay | Attending: Emergency Medicine | Admitting: Emergency Medicine

## 2019-09-05 DIAGNOSIS — Z5329 Procedure and treatment not carried out because of patient's decision for other reasons: Secondary | ICD-10-CM | POA: Insufficient documentation

## 2019-09-05 DIAGNOSIS — J449 Chronic obstructive pulmonary disease, unspecified: Secondary | ICD-10-CM | POA: Insufficient documentation

## 2019-09-05 DIAGNOSIS — R531 Weakness: Secondary | ICD-10-CM | POA: Insufficient documentation

## 2019-09-05 DIAGNOSIS — R0602 Shortness of breath: Secondary | ICD-10-CM | POA: Insufficient documentation

## 2019-09-05 DIAGNOSIS — F17210 Nicotine dependence, cigarettes, uncomplicated: Secondary | ICD-10-CM | POA: Insufficient documentation

## 2019-09-05 DIAGNOSIS — R42 Dizziness and giddiness: Secondary | ICD-10-CM | POA: Insufficient documentation

## 2019-09-05 DIAGNOSIS — R0789 Other chest pain: Secondary | ICD-10-CM | POA: Insufficient documentation

## 2019-09-05 LAB — CBC
HCT: 47.5 % (ref 39.0–52.0)
Hemoglobin: 15.1 g/dL (ref 13.0–17.0)
MCH: 29.1 pg (ref 26.0–34.0)
MCHC: 31.8 g/dL (ref 30.0–36.0)
MCV: 91.5 fL (ref 80.0–100.0)
Platelets: 255 10*3/uL (ref 150–400)
RBC: 5.19 MIL/uL (ref 4.22–5.81)
RDW: 14.4 % (ref 11.5–15.5)
WBC: 6.5 10*3/uL (ref 4.0–10.5)
nRBC: 0 % (ref 0.0–0.2)

## 2019-09-05 LAB — BASIC METABOLIC PANEL
Anion gap: 8 (ref 5–15)
BUN: 12 mg/dL (ref 6–20)
CO2: 28 mmol/L (ref 22–32)
Calcium: 9.2 mg/dL (ref 8.9–10.3)
Chloride: 106 mmol/L (ref 98–111)
Creatinine, Ser: 0.88 mg/dL (ref 0.61–1.24)
GFR calc Af Amer: >60 mL/min (ref >60)
GFR calc non Af Amer: >60 mL/min (ref >60)
Glucose, Bld: 97 mg/dL (ref 70–99)
Potassium: 4.3 mmol/L (ref 3.5–5.1)
Sodium: 142 mmol/L (ref 135–145)

## 2019-09-05 LAB — D-DIMER, QUANTITATIVE: D-Dimer, Quant: <0.27 ug/mL-FEU (ref 0.00–0.50)

## 2019-09-05 LAB — TROPONIN I (HIGH SENSITIVITY)
Troponin I (High Sensitivity): 3 ng/L (ref <18)
Troponin I (High Sensitivity): 3 ng/L (ref <18)

## 2019-09-05 MED ORDER — SODIUM CHLORIDE 0.9% FLUSH: 3.0000 mL | Freq: Once | INTRAVENOUS | Status: DC

## 2019-09-05 NOTE — ED Provider Notes (Signed)
[facility] EMERGENCY DEPARTMENT Provider Note   CSN: [id number] Arrival date & time: 09/05/19  1307     History   Chief Complaint Chief Complaint  Patient presents with  . Chest Pain    HPI William Steele is a 53 y.o. male.     Patient is a 53 year old male who presents to the emergency department with complaint of chest pain.  Patient states that earlier this morning approximately 5 hours ago he got up, was watching TV, and eating cereal when he developed a pain in his left upper chest.  He describes it as a dull ache.  He says the pain comes and goes.  It does not radiate.  Patient states he had a similar episode about 4 months ago at which time he was told by the emergency physician that his "heart was not pumping blood correctly".  The patient states he was told to follow-up with a cardiologist, but he never did because of the coronavirus.  Patient states he has had some episodes of this pain off and on over the last 4 months, but today was significantly worse.  Nothing makes the pain any better, nothing makes the pain any worse.  The patient states he took 2 aspirin tablets earlier in the morning.  He denies any unusual sweats, no vomiting, no loss of consciousness.    The history is provided by the patient.    Past Medical History:  Diagnosis Date  . Chronic back pain   . COPD (chronic obstructive pulmonary disease) (HCC)   . DDD (degenerative disc disease), cervical   . Pneumonia   . Sciatic pain     There are no active problems to display for this patient.   No past surgical history on file.      Home Medications    Prior to Admission medications   Medication Sig Start Date End Date Taking? Authorizing Provider  albuterol (PROVENTIL) (2.5 MG/3ML) 0.083% nebulizer solution Take 3 mLs (2.5 mg total) by nebulization every 6 (six) hours as needed for wheezing or shortness of breath. 10/14/18   Gerhard MunchLockwood, Robert, MD  ibuprofen (ADVIL,MOTRIN) 200 MG tablet Take  1,000-1,200 mg by mouth 4 (four) times daily as needed for moderate pain.     [provider]  predniSONE (DELTASONE) 20 MG tablet Take 2 tablets (40 mg total) by mouth daily with breakfast. For the next four days 10/14/18   Gerhard MunchLockwood, Robert, MD    Family History No family history on file.  Social History Social History   Tobacco Use  . Smoking status: Current Every Day Smoker    Packs/day: 1.00    Types: Cigarettes  . Smokeless tobacco: Never Used  Substance Use Topics  . Alcohol use: No  . Drug use: No     Allergies   Patient has no known allergies.   Review of Systems Review of Systems  Constitutional: Negative for activity change and appetite change.  HENT: Negative for congestion, ear discharge, ear pain, facial swelling, nosebleeds, rhinorrhea, sneezing and tinnitus.   Eyes: Negative for photophobia, pain and discharge.  Respiratory: Negative for cough, choking, shortness of breath and wheezing.   Cardiovascular: Negative for chest pain, palpitations and leg swelling.  Gastrointestinal: Negative for abdominal pain, blood in stool, constipation, diarrhea, nausea and vomiting.  Genitourinary: Negative for difficulty urinating, dysuria, flank pain, frequency and hematuria.  Musculoskeletal: Negative for back pain, gait problem, myalgias and neck pain.  Skin: Negative for color change, rash and wound.  Neurological:  Negative for dizziness, seizures, syncope, facial asymmetry, speech difficulty, weakness and numbness.  Hematological: Negative for adenopathy. Does not bruise/bleed easily.  Psychiatric/Behavioral: Negative for agitation, confusion, hallucinations, self-injury and suicidal ideas. The patient is not nervous/anxious.      Physical Exam Updated Vital Signs BP (!) 128/92 (BP Location: Right Arm)   Pulse 74   Temp 98.2 F (36.8 C) (Oral)   Resp 18   Ht 6' (1.829 m)   Wt 61.2 kg   SpO2 96%   BMI 18.31 kg/m   Physical Exam Vitals signs and  nursing note reviewed.  Constitutional:      Appearance: He is well-developed. He is not toxic-appearing.  HENT:     Head: Normocephalic.     Right Ear: Tympanic membrane and external ear normal.     Left Ear: Tympanic membrane and external ear normal.  Eyes:     General: Lids are normal.     Pupils: Pupils are equal, round, and reactive to light.  Neck:     Musculoskeletal: Normal range of motion and neck supple.     Vascular: No carotid bruit.  Cardiovascular:     Rate and Rhythm: Normal rate and regular rhythm.     Pulses: Normal pulses.     Heart sounds: Normal heart sounds.  Pulmonary:     Effort: No respiratory distress.     Breath sounds: Normal breath sounds.  Abdominal:     General: Bowel sounds are normal.     Palpations: Abdomen is soft.     Tenderness: There is no abdominal tenderness. There is no guarding.  Musculoskeletal: Normal range of motion.  Lymphadenopathy:     Head:     Right side of head: No submandibular adenopathy.     Left side of head: No submandibular adenopathy.     Cervical: No cervical adenopathy.  Skin:    General: Skin is warm and dry.  Neurological:     Mental Status: He is alert and oriented to person, place, and time.     Cranial Nerves: No cranial nerve deficit.     Sensory: No sensory deficit.  Psychiatric:        Speech: Speech normal.      ED Treatments / Results  Labs (all labs ordered are listed, but only abnormal results are displayed) Labs Reviewed  BASIC METABOLIC PANEL  CBC  TROPONIN I (HIGH SENSITIVITY)    EKG EKG Interpretation  Date/Time:  Saturday September 05 2019 13:24:23 EDT Ventricular Rate:  68 PR Interval:  114 QRS Duration: 90 QT Interval:  400 QTC Calculation: 425 R Axis:   72 Text Interpretation:  Normal sinus rhythm Normal ECG since last tracing no significant change Confirmed by Daleen Bo [phone]) on 09/05/2019 1:49:51 PM   Radiology Dg Chest 2 View  Result Date: 09/05/2019 CLINICAL  DATA:  Chest pain EXAM: CHEST - 2 VIEW COMPARISON:  01/10/2019, 12/31/2018 FINDINGS: The heart size and mediastinal contours are within normal limits. Emphysema. Suspect a new pulmonary nodule of the right upper lobe measuring 6 mm in projection. Scarring of the right lung base. The visualized skeletal structures are unremarkable. IMPRESSION: 1.  Emphysema.  No acute abnormality of the lungs. 2. Suspect a new pulmonary nodule of the right upper lobe measuring 6 mm in projection. Recommend nonemergent CT to further evaluate. Electronically Signed   By: Eddie Candle M.D.   On: 09/05/2019 13:50    Procedures Procedures (including critical care time)  Medications Ordered in ED Medications  sodium chloride flush (NS) 0.9 % injection 3 mL (has no administration in time range)     Initial Impression / Assessment and Plan / ED Course  I have reviewed the triage vital signs and the nursing notes.  Pertinent labs & imaging results that were available during my care of the patient were reviewed by me and considered in my medical decision making (see chart for details).          Final Clinical Impressions(s) / ED Diagnoses MDM  Patient is a 53 year old male who presents to the emergency department with complaint of left-sided chest pain.  The patient stated that this problem started approximately 5 hours before his arrival in the emergency department.  It was accompanied by some sensation of weakness, dizziness, and shortness of breath.  The patient states that he was told about 4 months ago by an emergency room physician that his heart was not pumping enough blood.  He was supposed to follow-up with cardiology, but has not done so as of yet.  Vital signs are within stable range.  Pulse oximetry is 96% on room air.  Within normal limits by my interpretation.  The electrocardiogram shows a normal sinus rhythm.  There is no evidence of an acute STEMI, and no evidence of any life-threatening  arrhythmias.  Initial troponin is negative for acute cardiac event at 3.  Complete blood count is normal.  Basic metabolic panel also within normal range.  Anion gap normal at 8.  Chest x-ray shows emphysema, but no acute abnormality.  D-dimer is less than 0.27.  No evidence for pulmonary embolism or related problem.  We discussed the findings of the examination as well as the laboratory evaluation up to this point with the patient in terms which he understands.  Questions were answered.  4:52 PM-notified by nursing staff that patient may have eloped.  5:15 PM.  Patient not in room.   Final diagnoses:  Atypical chest pain    ED Discharge Orders    None       Ivery Quale, Cordelia Poche 09/05/19 2014    Bethann Berkshire, MD 09/06/19 2016

## 2019-09-05 NOTE — ED Triage Notes (Signed)
Patient c/o left side chest pain, non radiating x5 hours. Patient reports shortness of breath, weakness, and dizziness. Patient states that he had "similar episode approx 4 months ago and told heart wasn't pumping enough blood." Patient denies any direct diagnosis. Per patient he took x2 81mg  aspirin today.

## 2019-09-05 NOTE — ED Notes (Signed)
Pharmacy tech went in room to complete med rec. Pt not in room or in bathroom. Leads pulled off and gown left on bed. Pt did not have IV access.

## 2019-10-11 ENCOUNTER — Other Ambulatory Visit: Payer: Self-pay

## 2019-10-11 ENCOUNTER — Encounter (HOSPITAL_COMMUNITY): Payer: Self-pay

## 2019-10-11 ENCOUNTER — Emergency Department (HOSPITAL_COMMUNITY): Payer: Self-pay

## 2019-10-11 ENCOUNTER — Emergency Department (HOSPITAL_COMMUNITY)
Admission: EM | Admit: 2019-10-11 | Discharge: 2019-10-11 | Disposition: A | Discharge status: Home / Self Care | Payer: Self-pay | Attending: Emergency Medicine | Admitting: Emergency Medicine

## 2019-10-11 DIAGNOSIS — R059 Cough, unspecified: Secondary | ICD-10-CM

## 2019-10-11 DIAGNOSIS — R0600 Dyspnea, unspecified: Secondary | ICD-10-CM

## 2019-10-11 DIAGNOSIS — F17210 Nicotine dependence, cigarettes, uncomplicated: Secondary | ICD-10-CM | POA: Insufficient documentation

## 2019-10-11 DIAGNOSIS — R0602 Shortness of breath: Secondary | ICD-10-CM | POA: Insufficient documentation

## 2019-10-11 DIAGNOSIS — Z79899 Other long term (current) drug therapy: Secondary | ICD-10-CM | POA: Insufficient documentation

## 2019-10-11 DIAGNOSIS — Z20828 Contact with and (suspected) exposure to other viral communicable diseases: Secondary | ICD-10-CM | POA: Insufficient documentation

## 2019-10-11 DIAGNOSIS — R05 Cough: Secondary | ICD-10-CM | POA: Insufficient documentation

## 2019-10-11 DIAGNOSIS — Z7982 Long term (current) use of aspirin: Secondary | ICD-10-CM | POA: Insufficient documentation

## 2019-10-11 DIAGNOSIS — J449 Chronic obstructive pulmonary disease, unspecified: Secondary | ICD-10-CM | POA: Insufficient documentation

## 2019-10-11 LAB — COMPREHENSIVE METABOLIC PANEL
ALT: 10 U/L (ref 0–44)
AST: 13 U/L — ABNORMAL LOW (ref 15–41)
Albumin: 3 g/dL — ABNORMAL LOW (ref 3.5–5.0)
Alkaline Phosphatase: 63 U/L (ref 38–126)
Anion gap: 7 (ref 5–15)
BUN: 13 mg/dL (ref 6–20)
CO2: 27 mmol/L (ref 22–32)
Calcium: 8.7 mg/dL — ABNORMAL LOW (ref 8.9–10.3)
Chloride: 105 mmol/L (ref 98–111)
Creatinine, Ser: 1.24 mg/dL (ref 0.61–1.24)
GFR calc Af Amer: >60 mL/min (ref >60)
GFR calc non Af Amer: >60 mL/min (ref >60)
Glucose, Bld: 92 mg/dL (ref 70–99)
Potassium: 4.3 mmol/L (ref 3.5–5.1)
Sodium: 139 mmol/L (ref 135–145)
Total Bilirubin: 0.4 mg/dL (ref 0.3–1.2)
Total Protein: 5.6 g/dL — ABNORMAL LOW (ref 6.5–8.1)

## 2019-10-11 LAB — CBC WITH DIFFERENTIAL/PLATELET
Abs Immature Granulocytes: 0.02 10*3/uL (ref 0.00–0.07)
Basophils Absolute: 0.1 10*3/uL (ref 0.0–0.1)
Basophils Relative: 1 %
Eosinophils Absolute: 0.1 10*3/uL (ref 0.0–0.5)
Eosinophils Relative: 1 %
HCT: 47.7 % (ref 39.0–52.0)
Hemoglobin: 15.5 g/dL (ref 13.0–17.0)
Immature Granulocytes: 0 %
Lymphocytes Relative: 30 %
Lymphs Abs: 1.8 10*3/uL (ref 0.7–4.0)
MCH: 29.4 pg (ref 26.0–34.0)
MCHC: 32.5 g/dL (ref 30.0–36.0)
MCV: 90.5 fL (ref 80.0–100.0)
Monocytes Absolute: 0.6 10*3/uL (ref 0.1–1.0)
Monocytes Relative: 10 %
Neutro Abs: 3.4 10*3/uL (ref 1.7–7.7)
Neutrophils Relative %: 58 %
Platelets: 278 10*3/uL (ref 150–400)
RBC: 5.27 MIL/uL (ref 4.22–5.81)
RDW: 14.2 % (ref 11.5–15.5)
WBC: 6 10*3/uL (ref 4.0–10.5)
nRBC: 0 % (ref 0.0–0.2)

## 2019-10-11 MED ORDER — AEROCHAMBER PLUS FLO-VU LARGE MISC
1.0000 | Freq: Once | Status: AC
  Administered 2019-10-11: 1
  Filled 2019-10-11: qty 1

## 2019-10-11 MED ORDER — DEXAMETHASONE SODIUM PHOSPHATE 10 MG/ML IJ SOLN
10.0000 mg | Freq: Once | INTRAMUSCULAR | Status: AC
  Administered 2019-10-11: 13:00:00 10 mg via INTRAMUSCULAR
  Filled 2019-10-11: qty 1

## 2019-10-11 MED ORDER — ALBUTEROL SULFATE HFA 108 (90 BASE) MCG/ACT IN AERS
4.0000 | INHALATION_SPRAY | Freq: Once | RESPIRATORY_TRACT | Status: AC
  Administered 2019-10-11: 13:00:00 4 via RESPIRATORY_TRACT
  Filled 2019-10-11: qty 6.7

## 2019-10-11 MED ORDER — FLUTICASONE PROPIONATE 50 MCG/ACT NA SUSP: 1.0000 | Freq: Every day | NASAL | 0 refills | Status: DC

## 2019-10-11 MED ORDER — BENZONATATE 100 MG PO CAPS: 100.0000 mg | ORAL_CAPSULE | Freq: Three times a day (TID) | ORAL | 0 refills | Status: DC

## 2019-10-11 NOTE — Discharge Instructions (Signed)
You were seen in the ER today for cough, trouble breathing, diarrhea, congestion, sore throat, & chills. Your chest xray showed findings consistent with your COPD. Your labs showed that your protein/albumin were a bit low- please have these rechecked by primary care within 1-2 weeks. Your EKG was fairly normal.   At  this time we suspect your symptoms are viral in nature. We are sending you home with the following medicines:  - Flonase- use 1 spray per nostril daily as needed for congestion - Tessalon- use 1 tablet every 8 hours as needed for coughing.  - Albuterol inhaler- use 1-2 puffs ever 4-6 hours as needed for trouble breathing.   We have prescribed you new medication(s) today. Discuss the medications prescribed today with your pharmacist as they can have adverse effects and interactions with your other medicines including over the counter and prescribed medications. Seek medical evaluation if you start to experience new or abnormal symptoms after taking one of these medicines, seek care immediately if you start to experience difficulty breathing, feeling of your throat closing, facial swelling, or rash as these could be indications of a more serious allergic reaction  Please follow attached diet guidelines to help with diarrhea.   We have tested you for COVID 19, we will call you within 72 hours if your results are positive.   We are instructing patient's with Covid or symptoms concerning for this to quarantine themselves for 14 days. You may be able to discontinue self quarantine if the following conditions are met:   Persons with COVID-19 who have symptoms and were directed to care for themselves at home may discontinue home isolation under the  following conditions: - It has been at least 7 days have passed since symptoms first appeared. - AND at least 3 days (72 hours) have passed since recovery defined as resolution of fever without the use of fever-reducing medications and improvement  in respiratory symptoms (e.g., cough, shortness of breath)  Please follow the below quarantine instructions.   Please follow up with primary care within 3-5 days for re-evaluation- call prior to going to the office to make them aware of your symptoms. Return to the ER for new or worsening symptoms including but not limited to increased work of breathing, fever, chest pain, passing out, or any other concerns.   If you do not have primary care these are some local options.   [facility] Primary Care Doctor List    Sinda Du MD. Specialty: Pulmonary Disease Contact information: [place]  [place] [address]  [phone]   Tula Nakayama, MD. Specialty: [facility] Contact information: [address], [address] [place] [address]  [phone]   Sallee Lange, MD. Specialty: [facility] Medicine Contact information: [place]  [place] [address]  [phone]   Rosita Fire, MD Specialty: Internal Medicine Contact information: [place] [address]  [phone]   Delphina Cahill, MD. Specialty: Internal Medicine Contact information: [place] [address]  [phone]    [facility] (Dr. Maudie Mercury) Specialty: Family Medicine Contact information: [place] [address]  [phone]   Leslie Andrea, MD. Specialty: [facility] Contact information: [place] [address]  [phone]   Asencion Noble, MD. Specialty: Internal Medicine Contact information: [place]  [place] [address]  [phone]    [facility]  [place]  [address] [place], [place] [address] [phone]  Services [facility] offers a variety of basic health services.  Services include but are not limited to: Blood pressure checks  Heart rate checks  Blood sugar checks   Urine analysis  Rapid strep tests  Pregnancy tests.  Health education and referrals  People needing more complex services will be directed to a physician online. Using these virtual visits, doctors can evaluate and prescribe medicine and treatments. There will be no medication on-site, though [facility] will help patients fill their prescriptions at little to no cost.   For More information please go to: [email or web address]        Person Under Monitoring Name: William Steele  Location: [address] [place] [address]   Infection Prevention Recommendations for Individuals Confirmed to have, or Being Evaluated for, 2019 Novel Coronavirus (COVID-19) Infection Who Receive Care at Home  Individuals who are confirmed to have, or are being evaluated for, COVID-19 should follow the prevention steps below until a healthcare provider or local or state health department says they can return to normal activities.  Stay home except to get medical care You should restrict activities outside your home, except for getting medical care. Do not go to work, school, or public areas, and do not use public transportation or taxis.  Call ahead before visiting your doctor Before your medical appointment, call the healthcare provider and tell them that you have, or are being evaluated for, COVID-19 infection. This will help the healthcare providers office take steps to keep other people from getting infected. Ask your healthcare provider to call the local or state health department.  Monitor your symptoms Seek prompt medical attention if your illness is worsening (e.g., difficulty breathing). Before going to your medical appointment, call the healthcare provider and tell them that you have, or are being evaluated for, COVID-19 infection. Ask your healthcare provider to call the local or state health department.  Wear a facemask You  should wear a facemask that covers your nose and mouth when you are in the same room with other people and when you visit a healthcare provider. People who live with or visit you should also wear a facemask while they are in the same room with you.  Separate yourself from other people in your home As much as possible, you should stay in a different room from other people in your home. Also, you should use a separate bathroom, if available.  Avoid sharing household items You should not share dishes, drinking glasses, cups, eating utensils, towels, bedding, or other items with other people in your home. After using these items, you should wash them thoroughly with soap and water.  Cover your coughs and sneezes Cover your mouth and nose with a tissue when you cough or sneeze, or you can cough or sneeze into your sleeve. Throw used tissues in a lined trash can, and immediately wash your hands with soap and water for at least 20 seconds or use an alcohol-based hand rub.  Wash your [facility] your hands often and thoroughly with soap and water for at least 20 seconds. You can use an alcohol-based hand sanitizer if soap and water are not available and if your hands are not visibly dirty. Avoid touching your eyes, nose, and mouth with unwashed hands.   Prevention Steps for Caregivers and Household Members of Individuals Confirmed to have,  or Being Evaluated for, COVID-19 Infection Being Cared for in the Home  If you live with, or provide care at home for, a person confirmed to have, or being evaluated for, COVID-19 infection please follow these guidelines to prevent infection:  Follow healthcare providers instructions Make sure that you understand and can help the patient follow any healthcare provider instructions for all care.  Provide for the patients basic needs You should help the patient with basic needs in the home and provide support for getting groceries, prescriptions, and other  personal needs.  Monitor the patients symptoms If they are getting sicker, call his or her medical provider and tell them that the patient has, or is being evaluated for, COVID-19 infection. This will help the healthcare providers office take steps to keep other people from getting infected. Ask the healthcare provider to call the local or state health department.  Limit the number of people who have contact with the patient If possible, have only one caregiver for the patient. Other household members should stay in another home or place of residence. If this is not possible, they should stay in another room, or be separated from the patient as much as possible. Use a separate bathroom, if available. Restrict visitors who do not have an essential need to be in the home.  Keep older adults, very young children, and other sick people away from the patient Keep older adults, very young children, and those who have compromised immune systems or chronic health conditions away from the patient. This includes people with chronic heart, lung, or kidney conditions, diabetes, and cancer.  Ensure good ventilation Make sure that shared spaces in the home have good air flow, such as from an air conditioner or an opened window, weather permitting.  Wash your hands often Wash your hands often and thoroughly with soap and water for at least 20 seconds. You can use an alcohol based hand sanitizer if soap and water are not available and if your hands are not visibly dirty. Avoid touching your eyes, nose, and mouth with unwashed hands. Use disposable paper towels to dry your hands. If not available, use dedicated cloth towels and replace them when they become wet.  Wear a facemask and gloves Wear a disposable facemask at all times in the room and gloves when you touch or have contact with the patients blood, body fluids, and/or secretions or excretions, such as sweat, saliva, sputum, nasal mucus, vomit,  urine, or feces.  Ensure the mask fits over your nose and mouth tightly, and do not touch it during use. Throw out disposable facemasks and gloves after using them. Do not reuse. Wash your hands immediately after removing your facemask and gloves. If your personal clothing becomes contaminated, carefully remove clothing and launder. Wash your hands after handling contaminated clothing. Place all used disposable facemasks, gloves, and other waste in a lined container before disposing them with other household waste. Remove gloves and wash your hands immediately after handling these items.  Do not share dishes, glasses, or other household items with the patient Avoid sharing household items. You should not share dishes, drinking glasses, cups, eating utensils, towels, bedding, or other items with a patient who is confirmed to have, or being evaluated for, COVID-19 infection. After the person uses these items, you should wash them thoroughly with soap and water.  Wash laundry thoroughly Immediately remove and wash clothes or bedding that have blood, body fluids, and/or secretions or excretions, such as sweat, saliva, sputum, nasal  mucus, vomit, urine, or feces, on them. Wear gloves when handling laundry from the patient. Read and follow directions on labels of laundry or clothing items and detergent. In general, wash and dry with the warmest temperatures recommended on the label.  Clean all areas the individual has used often Clean all touchable surfaces, such as counters, tabletops, doorknobs, bathroom fixtures, toilets, phones, keyboards, tablets, and bedside tables, every day. Also, clean any surfaces that may have blood, body fluids, and/or secretions or excretions on them. Wear gloves when cleaning surfaces the patient has come in contact with. Use a diluted bleach solution (e.g., dilute bleach with 1 part bleach and 10 parts water) or a household disinfectant with a label that says  EPA-registered for coronaviruses. To make a bleach solution at home, add 1 tablespoon of bleach to 1 quart (4 cups) of water. For a larger supply, add  cup of bleach to 1 gallon (16 cups) of water. Read labels of cleaning products and follow recommendations provided on product labels. Labels contain instructions for safe and effective use of the cleaning product including precautions you should take when applying the product, such as wearing gloves or eye protection and making sure you have good ventilation during use of the product. Remove gloves and wash hands immediately after cleaning.  Monitor yourself for signs and symptoms of illness Caregivers and household members are considered close contacts, should monitor their health, and will be asked to limit movement outside of the home to the extent possible. Follow the monitoring steps for close contacts listed on the symptom monitoring form.   ? If you have additional questions, contact your local health department or call the epidemiologist on call at [phone] (available 24/7). ? This guidance is subject to change. For the most up-to-date guidance from [facility], please refer to their website: [email or web address]

## 2019-10-11 NOTE — ED Notes (Signed)
URI vs covid sx  Here for eval

## 2019-10-11 NOTE — ED Provider Notes (Signed)
[facility] EMERGENCY DEPARTMENT Provider Note   CSN: [id number] Arrival date & time: 10/11/19  1129     History   Chief Complaint Chief Complaint  Patient presents with   Cough    HPI William Steele is a 53 y.o. male with a history of tobacco abuse and COPD who presents to the ED with complaints of cough since yesterday. Patient states he has felt poorly with symptoms including nasal congestion, sore throat, dry cough, intermittent dyspnea, chest tightness, wheezing, chills, and 5-6 episodes of loose stools. No alleviating/aggravating factors. No intervention PTA. Concerned about covid 19, no known exposures. Denies fever, nausea, vomiting, chest pain, leg pains/swelling, body aches, abdominal pain, or syncope. He has run out of his inhalers at home. This does not feel like prior COPD exacerbations.      HPI  Past Medical History:  Diagnosis Date   Chronic back pain    COPD (chronic obstructive pulmonary disease) (HCC)    DDD (degenerative disc disease), cervical    Pneumonia    Sciatic pain     There are no active problems to display for this patient.   History reviewed. No pertinent surgical history.      Home Medications    Prior to Admission medications   Medication Sig Start Date End Date Taking? Authorizing Provider  albuterol (PROVENTIL) (2.5 MG/3ML) 0.083% nebulizer solution Take 3 mLs (2.5 mg total) by nebulization every 6 (six) hours as needed for wheezing or shortness of breath. 10/14/18   Carmin Muskrat, MD  aspirin EC 81 MG tablet Take 162 mg by mouth once as needed for moderate pain.    [provider]  ibuprofen (ADVIL,MOTRIN) 200 MG tablet Take 1,000-1,200 mg by mouth 4 (four) times daily as needed for moderate pain.     [provider]  predniSONE (DELTASONE) 20 MG tablet Take 2 tablets (40 mg total) by mouth daily with breakfast. For the next four days 10/14/18   Carmin Muskrat, MD    Family History No family history on  file.  Social History Social History   Tobacco Use   Smoking status: Current Every Day Smoker    Packs/day: 1.00    Types: Cigarettes   Smokeless tobacco: Never Used  Substance Use Topics   Alcohol use: No   Drug use: No     Allergies   Patient has no known allergies.   Review of Systems Review of Systems  Constitutional: Positive for chills. Negative for fever.  HENT: Positive for congestion and sore throat. Negative for ear pain.   Respiratory: Positive for cough, chest tightness, shortness of breath and wheezing.   Cardiovascular: Negative for chest pain and leg swelling.  Gastrointestinal: Positive for diarrhea. Negative for abdominal pain, blood in stool, nausea and vomiting.  Genitourinary: Negative for dysuria.  Musculoskeletal: Negative for myalgias.  Neurological: Negative for syncope.  All other systems reviewed and are negative.   Physical Exam Updated Vital Signs BP 125/90 (BP Location: Right Arm)    Pulse 66    Temp 97.8 F (36.6 C) (Oral)    Resp 18    Ht 6' (1.829 m)    Wt 61.2 kg    SpO2 100%    BMI 18.31 kg/m   Physical Exam Vitals signs and nursing note reviewed.  Constitutional:      General: He is not in acute distress.    Appearance: He is well-developed. He is not toxic-appearing.  HENT:     Head: Normocephalic and atraumatic.  Right Ear: Ear canal normal. Tympanic membrane is not perforated, erythematous, retracted or bulging.     Left Ear: Ear canal normal. Tympanic membrane is not perforated, erythematous, retracted or bulging.     Ears:     Comments: No mastoid erythema/swelling/tenderness.     Nose: Congestion present.     Right Sinus: No maxillary sinus tenderness or frontal sinus tenderness.     Left Sinus: No maxillary sinus tenderness or frontal sinus tenderness.     Mouth/Throat:     Pharynx: Uvula midline. No oropharyngeal exudate or posterior oropharyngeal erythema.     Tonsils: No tonsillar exudate.     Comments:  Posterior oropharynx is symmetric appearing. Patient tolerating own secretions without difficulty. No trismus. No drooling. No hot potato voice. No swelling beneath the tongue, submandibular compartment is soft.  Eyes:     General:        Right eye: No discharge.        Left eye: No discharge.     Conjunctiva/sclera: Conjunctivae normal.  Neck:     Musculoskeletal: Neck supple. No neck rigidity.  Cardiovascular:     Rate and Rhythm: Normal rate and regular rhythm.  Pulmonary:     Effort: Pulmonary effort is normal. No respiratory distress.     Breath sounds: Normal breath sounds. No wheezing, rhonchi or rales.  Abdominal:     General: There is no distension.     Palpations: Abdomen is soft.     Tenderness: There is no abdominal tenderness.  Lymphadenopathy:     Cervical: No cervical adenopathy.  Skin:    General: Skin is warm and dry.     Findings: No rash.  Neurological:     Mental Status: He is alert.     Comments: Clear speech.   Psychiatric:        Behavior: Behavior normal.    ED Treatments / Results  Labs (all labs ordered are listed, but only abnormal results are displayed) Labs Reviewed  COMPREHENSIVE METABOLIC PANEL - Abnormal; Notable for the following components:      Result Value   Calcium 8.7 (*)    Total Protein 5.6 (*)    Albumin 3.0 (*)    AST 13 (*)    All other components within normal limits  CBC WITH DIFFERENTIAL/PLATELET    EKG None   Date: 10/11/2019  Rate: 65 bpm  Rhythm: normal sinus rhythm  QRS Axis: normal  Intervals: normal  ST/T Wave abnormalities: normal  Conduction Disutrbances: none  Narrative Interpretation: No STEMI      Radiology Dg Chest Port 1 View  Result Date: 10/11/2019 CLINICAL DATA:  Shortness of breath with cough and congestion EXAM: PORTABLE CHEST 1 VIEW COMPARISON:  September 05, 2019 chest radiograph and chest CT June 29, 2010 FINDINGS: Lungs are somewhat hyperexpanded with areas of apparent bullous disease in  the upper lobes, better delineated on prior CT. There are areas of mild scarring bilaterally. There is no frank edema or consolidation. Heart size and pulmonary vascularity are normal. No adenopathy. There is an apparent skin fold on the right. No pneumothorax evident. No bone lesions. 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 No edema or consolidation. Scattered areas of scarring. Cardiac silhouette within normal limits. Electronically Signed   By: Bretta BangWilliam  Woodruff III M.D.   On: 10/11/2019 12:56    Procedures Procedures (including critical care time)  Medications Ordered in ED Medications  albuterol (VENTOLIN HFA) 108 (90 Base) MCG/ACT inhaler 4  puff (4 puffs Inhalation Given 10/11/19 1256)  AeroChamber Plus Flo-Vu Large MISC 1 each (1 each Other Given 10/11/19 1257)  dexamethasone (DECADRON) injection 10 mg (10 mg Intramuscular Given 10/11/19 1255)     Initial Impression / Assessment and Plan / ED Course  I have reviewed the triage vital signs and the nursing notes.  Pertinent labs & imaging results that were available during my care of the patient were reviewed by me and considered in my medical decision making (see chart for details).    Patient presents to the ED with complaints of congestion, sore throat, respiratory sxs, & diarrhea. Nontoxic appearing, vitals WNL. Afebrile, no sinus tenderness, sxs < 10 days, doubt acute bacterial sinusitis. Exam not consistent with AOM/AOE/mastoiditis. Centor 0- doubt strep. No meningismus. No signs of respiratory distress, SpO2 100% on RA. No wheezing, patient states does not feel like a COPD issues- given inhaler & IM decadron in the ED, but do not feel patient needs full course of steroids for COPD exacerbation based on his current presentation and he is in agreement. Lungs CTA, no infiltrate on CXR- doubt CAP. CXR reveals lungs hyperexpanded with bullous disease in the upper lobes. No edema or consolidation. Scattered  areas of scarring. Cardiac silhouette within normal limits. No PTX. EKG with NSR, no ischemic changes to suggest ACS. Low risk wells- doubt PE. Abdomen without peritoneal signs. Labs overall reassuring. Mildly low protein- PCP recheck. Overall suspect viral process, test for COVID 19. Presentation does not seem consistent with influenza particularly. Supportive care. I discussed results, treatment plan, need for follow-up, and return precautions with the patient. Provided opportunity for questions, patient confirmed understanding and is in agreement with plan.   CYPHER PAULE was evaluated in Emergency Department on 10/11/2019 for the symptoms described in the history of present illness. He/she was evaluated in the context of the global COVID-19 pandemic, which necessitated consideration that the patient might be at risk for infection with the SARS-CoV-2 virus that causes COVID-19. Institutional protocols and algorithms that pertain to the evaluation of patients at risk for COVID-19 are in a state of rapid change based on information released by regulatory bodies including the CDC and federal and state organizations. These policies and algorithms were followed during the patient's care in the ED.   Final Clinical Impressions(s) / ED Diagnoses   Final diagnoses:  Cough    ED Discharge Orders         Ordered    fluticasone (FLONASE) 50 MCG/ACT nasal spray  Daily     10/11/19 1340    benzonatate (TESSALON) 100 MG capsule  Every 8 hours     10/11/19 1340           Vartan Kerins, [place], PA-C 10/11/19 1345    Jacalyn Lefevre, MD 10/11/19 1515

## 2019-10-11 NOTE — ED Triage Notes (Signed)
Pt presents to ED with complaints of cough, nasal congestion, runny nose, sob, headache and diarrhea since yesterday. Pt denies fever.

## 2019-10-12 LAB — NOVEL CORONAVIRUS, NAA (HOSP ORDER, SEND-OUT TO REF LAB; TAT 18-24 HRS): SARS-CoV-2, NAA: NOT DETECTED

## 2020-07-27 ENCOUNTER — Encounter (HOSPITAL_COMMUNITY): Payer: Self-pay

## 2020-07-27 ENCOUNTER — Emergency Department (HOSPITAL_COMMUNITY)
Admission: EM | Admit: 2020-07-27 | Discharge: 2020-07-27 | Disposition: A | Discharge status: Home / Self Care | Payer: Self-pay | Attending: Emergency Medicine | Admitting: Emergency Medicine

## 2020-07-27 ENCOUNTER — Other Ambulatory Visit: Payer: Self-pay

## 2020-07-27 ENCOUNTER — Emergency Department (HOSPITAL_COMMUNITY): Payer: Self-pay

## 2020-07-27 DIAGNOSIS — R0602 Shortness of breath: Secondary | ICD-10-CM | POA: Insufficient documentation

## 2020-07-27 DIAGNOSIS — R05 Cough: Secondary | ICD-10-CM | POA: Insufficient documentation

## 2020-07-27 DIAGNOSIS — H5789 Other specified disorders of eye and adnexa: Secondary | ICD-10-CM | POA: Insufficient documentation

## 2020-07-27 DIAGNOSIS — Z5321 Procedure and treatment not carried out due to patient leaving prior to being seen by health care provider: Secondary | ICD-10-CM | POA: Insufficient documentation

## 2020-07-27 DIAGNOSIS — J189 Pneumonia, unspecified organism: Secondary | ICD-10-CM | POA: Insufficient documentation

## 2020-07-27 NOTE — ED Triage Notes (Signed)
Pt to er, pt states that he thinks that he has pneumonia,  Pt states that he gets pneumonia about twice a year, states that he has pain with breathing, a cough and some sob.  Pt states that he also has some redness in his eye he would like to have checked.

## 2022-05-18 ENCOUNTER — Encounter: Payer: Self-pay | Admitting: Emergency Medicine

## 2022-05-18 ENCOUNTER — Other Ambulatory Visit: Payer: Self-pay

## 2022-05-18 ENCOUNTER — Ambulatory Visit
Admission: EM | Admit: 2022-05-18 | Discharge: 2022-05-18 | Disposition: A | Discharge status: Home / Self Care | Payer: Medicaid Other | Attending: Nurse Practitioner | Admitting: Nurse Practitioner

## 2022-05-18 DIAGNOSIS — L02412 Cutaneous abscess of left axilla: Secondary | ICD-10-CM

## 2022-05-18 MED ORDER — DOXYCYCLINE HYCLATE 100 MG PO TABS: 100.0000 mg | ORAL_TABLET | Freq: Two times a day (BID) | ORAL | 0 refills | Status: DC

## 2022-05-18 NOTE — ED Triage Notes (Signed)
Pt reports removed tick from left axilla x1.5 weeks ago. Pt reports removed tick and states "this bump come up" and it has hurt ever since.   Moderate red, raised area with white tip noted to left axilla. Pt reports intermittent nausea.

## 2022-05-18 NOTE — ED Provider Notes (Signed)
[facility]    CSN: [id number] Arrival date & time: 05/18/22  0806      History   Chief Complaint Chief Complaint  Patient presents with   Abscess    HPI William Steele is a 56 y.o. male.   The history is provided by the patient.    Patient presents with a possible abscess under the left arm after he removed a tick approximately 1-1/2 weeks ago.  He states since that time, over the past 3 days, he has had swelling, pain, and redness under the left arm.  The area has increased in size, and is painful and warm to palpation.  Patient denies fever, chills, headache, chest pain, shortness of breath, difficulty breathing.  He has complained of intermittent nausea.  States he has been taking warm showers and using peroxide on the area.  Past Medical History:  Diagnosis Date   Chronic back pain    COPD (chronic obstructive pulmonary disease) (HCC)    DDD (degenerative disc disease), cervical    Pneumonia    Sciatic pain     There are no problems to display for this patient.   History reviewed. No pertinent surgical history.     Home Medications    Prior to Admission medications   Medication Sig Start Date End Date Taking? Authorizing Provider  doxycycline (VIBRA-TABS) 100 MG tablet Take 1 tablet (100 mg total) by mouth 2 (two) times daily. 05/18/22  Yes Elsye Mccollister-Warren, Sadie Haber, NP  albuterol (PROVENTIL) (2.5 MG/3ML) 0.083% nebulizer solution Take 3 mLs (2.5 mg total) by nebulization every 6 (six) hours as needed for wheezing or shortness of breath. 10/14/18   Gerhard Munch, MD  aspirin EC 81 MG tablet Take 162 mg by mouth once as needed for moderate pain.    [provider]  benzonatate (TESSALON) 100 MG capsule Take 1 capsule (100 mg total) by mouth every 8 (eight) hours. 10/11/19   Petrucelli, Samantha R, PA-C  fluticasone (FLONASE) 50 MCG/ACT nasal spray Place 1 spray into both nostrils daily. 10/11/19   Petrucelli, Samantha R, PA-C  ibuprofen  (ADVIL,MOTRIN) 200 MG tablet Take 1,000-1,200 mg by mouth 4 (four) times daily as needed for moderate pain.     [provider]  predniSONE (DELTASONE) 20 MG tablet Take 2 tablets (40 mg total) by mouth daily with breakfast. For the next four days 10/14/18   Gerhard Munch, MD    Family History History reviewed. No pertinent family history.  Social History Social History   Tobacco Use   Smoking status: Every Day    Packs/day: 1.00    Types: Cigarettes   Smokeless tobacco: Never  Vaping Use   Vaping Use: Never used  Substance Use Topics   Alcohol use: No   Drug use: No     Allergies   Patient has no known allergies.   Review of Systems Review of Systems Per HPI  Physical Exam Triage Vital Signs ED Triage Vitals  Enc Vitals Group     BP 05/18/22 0817 (!) 139/96     Pulse Rate 05/18/22 0817 79     Resp 05/18/22 0817 16     Temp 05/18/22 0817 98 F (36.7 C)     Temp Source 05/18/22 0817 Oral     SpO2 05/18/22 0817 96 %     Weight --      Height --      Head Circumference --      Peak Flow --  Pain Score 05/18/22 0826 10     Pain Loc --      Pain Edu? --      Excl. in GC? --    No data found.  Updated Vital Signs BP (!) 139/96 (BP Location: Right Arm)   Pulse 79   Temp 98 F (36.7 C) (Oral)   Resp 16   SpO2 96%   Visual Acuity Right Eye Distance:   Left Eye Distance:   Bilateral Distance:    Right Eye Near:   Left Eye Near:    Bilateral Near:     Physical Exam Vitals and nursing note reviewed.  Constitutional:      Appearance: Normal appearance.  HENT:     Head: Normocephalic.     Nose: Nose normal.  Eyes:     Extraocular Movements: Extraocular movements intact.     Conjunctiva/sclera: Conjunctivae normal.     Pupils: Pupils are equal, round, and reactive to light.  Cardiovascular:     Rate and Rhythm: Normal rate and regular rhythm.     Pulses: Normal pulses.     Heart sounds: Normal heart sounds.  Pulmonary:     Effort:  Pulmonary effort is normal.     Breath sounds: Normal breath sounds.  Abdominal:     General: Bowel sounds are normal.     Palpations: Abdomen is soft.  Musculoskeletal:     Cervical back: Normal range of motion.  Lymphadenopathy:     Cervical: No cervical adenopathy.  Skin:    General: Skin is warm and dry.     Findings: Abscess, erythema and wound present.     Comments: Abscess noted under the left axilla.  Area measures approximately 2 cm in diameter.  Central core has pus present that is able to be expelled with compression.  No other areas of fluctuance are noted.  Area is surrounded by erythematous base.  I&D is not indicated at this time.  Neurological:     General: No focal deficit present.     Mental Status: He is alert and oriented to person, place, and time.  Psychiatric:        Mood and Affect: Mood normal.        Behavior: Behavior normal.      UC Treatments / Results  Labs (all labs ordered are listed, but only abnormal results are displayed) Labs Reviewed - No data to display  EKG   Radiology No results found.  Procedures Procedures (including critical care time)  Medications Ordered in UC Medications - No data to display  Initial Impression / Assessment and Plan / UC Course  I have reviewed the triage vital signs and the nursing notes.  Pertinent labs & imaging results that were available during my care of the patient were reviewed by me and considered in my medical decision making (see chart for details).  Patient presents with abscess to the left axilla.  On exam, was able to expectorate small amount of pus from the abscess.  No fluctuance or other oozing could be appreciated.  Area was not appropriate for further I&D at this time.  We will start patient on doxycycline for his symptoms.  Supportive care recommendations were provided to the patient with strict indications of when to follow-up.  Patient verbalizes understanding.  All questions were  answered. Final Clinical Impressions(s) / UC Diagnoses   Final diagnoses:  Abscess of left axilla     Discharge Instructions      Take medication as  prescribed. Clean the area twice daily with Dial Gold bar soap.  This is to help prevent further infection. Warm compresses to the affected area 3-4 times daily. Do not pick or disrupt the area. Follow-up immediately if you develop fever, chills, the area becomes larger in size, or if you develop increasing redness, streaking, or foul-smelling drainage.     ED Prescriptions     Medication Sig Dispense Auth. Provider   doxycycline (VIBRA-TABS) 100 MG tablet Take 1 tablet (100 mg total) by mouth 2 (two) times daily. 20 tablet Yarieliz Wasser-Warren, Sadie Haber, NP      PDMP not reviewed this encounter.   Abran Cantor, NP 05/18/22 1003

## 2022-05-18 NOTE — Discharge Instructions (Signed)
Take medication as prescribed. Clean the area twice daily with Dial Gold bar soap.  This is to help prevent further infection. Warm compresses to the affected area 3-4 times daily. Do not pick or disrupt the area. Follow-up immediately if you develop fever, chills, the area becomes larger in size, or if you develop increasing redness, streaking, or foul-smelling drainage.

## 2022-05-24 ENCOUNTER — Emergency Department (HOSPITAL_COMMUNITY): Payer: Medicaid Other

## 2022-05-24 ENCOUNTER — Other Ambulatory Visit: Payer: Self-pay

## 2022-05-24 ENCOUNTER — Encounter (HOSPITAL_COMMUNITY): Payer: Self-pay

## 2022-05-24 ENCOUNTER — Emergency Department (HOSPITAL_COMMUNITY)
Admission: EM | Admit: 2022-05-24 | Discharge: 2022-05-24 | Disposition: A | Discharge status: Home / Self Care | Payer: Medicaid Other | Attending: Emergency Medicine | Admitting: Emergency Medicine

## 2022-05-24 DIAGNOSIS — M25512 Pain in left shoulder: Secondary | ICD-10-CM | POA: Insufficient documentation

## 2022-05-24 DIAGNOSIS — Z7951 Long term (current) use of inhaled steroids: Secondary | ICD-10-CM | POA: Diagnosis not present

## 2022-05-24 DIAGNOSIS — W228XXA Striking against or struck by other objects, initial encounter: Secondary | ICD-10-CM | POA: Diagnosis not present

## 2022-05-24 DIAGNOSIS — Z7982 Long term (current) use of aspirin: Secondary | ICD-10-CM | POA: Diagnosis not present

## 2022-05-24 DIAGNOSIS — S199XXA Unspecified injury of neck, initial encounter: Secondary | ICD-10-CM | POA: Diagnosis present

## 2022-05-24 DIAGNOSIS — R519 Headache, unspecified: Secondary | ICD-10-CM | POA: Insufficient documentation

## 2022-05-24 DIAGNOSIS — M25511 Pain in right shoulder: Secondary | ICD-10-CM | POA: Insufficient documentation

## 2022-05-24 DIAGNOSIS — S134XXA Sprain of ligaments of cervical spine, initial encounter: Secondary | ICD-10-CM | POA: Insufficient documentation

## 2022-05-24 DIAGNOSIS — F17200 Nicotine dependence, unspecified, uncomplicated: Secondary | ICD-10-CM | POA: Insufficient documentation

## 2022-05-24 DIAGNOSIS — J449 Chronic obstructive pulmonary disease, unspecified: Secondary | ICD-10-CM | POA: Diagnosis not present

## 2022-05-24 DIAGNOSIS — S0081XA Abrasion of other part of head, initial encounter: Secondary | ICD-10-CM | POA: Insufficient documentation

## 2022-05-24 MED ORDER — GABAPENTIN 300 MG PO CAPS: 300.0000 mg | ORAL_CAPSULE | Freq: Two times a day (BID) | ORAL | 0 refills | Status: AC

## 2022-05-24 MED ORDER — METHOCARBAMOL 500 MG PO TABS: 500.0000 mg | ORAL_TABLET | Freq: Three times a day (TID) | ORAL | 0 refills | Status: AC | PRN

## 2022-05-24 MED ORDER — METHOCARBAMOL 500 MG PO TABS
500.0000 mg | ORAL_TABLET | Freq: Once | ORAL | Status: AC
  Administered 2022-05-24: 500 mg via ORAL
  Filled 2022-05-24: qty 1

## 2022-05-24 MED ORDER — HYDROCODONE-ACETAMINOPHEN 5-325 MG PO TABS
1.0000 | ORAL_TABLET | Freq: Once | ORAL | Status: AC
  Administered 2022-05-24: 1 via ORAL
  Filled 2022-05-24: qty 1

## 2022-05-24 MED ORDER — NICOTINE 14 MG/24HR TD PT24
14.0000 mg | MEDICATED_PATCH | Freq: Once | TRANSDERMAL | Status: DC
  Administered 2022-05-24: 14 mg via TRANSDERMAL
  Filled 2022-05-24: qty 1

## 2022-05-24 MED ORDER — GABAPENTIN 300 MG PO CAPS
300.0000 mg | ORAL_CAPSULE | Freq: Once | ORAL | Status: AC
  Administered 2022-05-24: 300 mg via ORAL
  Filled 2022-05-24: qty 1

## 2022-05-24 MED ORDER — NAPROXEN 375 MG PO TABS: 375.0000 mg | ORAL_TABLET | Freq: Two times a day (BID) | ORAL | 0 refills | Status: AC

## 2022-05-24 MED ORDER — NAPROXEN 250 MG PO TABS
375.0000 mg | ORAL_TABLET | Freq: Once | ORAL | Status: AC
  Administered 2022-05-24: 375 mg via ORAL
  Filled 2022-05-24: qty 2

## 2022-05-24 NOTE — ED Triage Notes (Signed)
Pt hit a rock after diving off the dock 2 days ago. Pt c/o neck and shoulder pain

## 2022-05-24 NOTE — Discharge Instructions (Addendum)
You may wear the cervical collar for comfort but it is not mandatory as your neck is not unstable.  You are being given NSAIDs, muscle relaxer and gabapentin to help with your symptoms.  You need to follow-up with a neurosurgeon in 1-2 weeks, call their office tomorrow to set this up.  If you develop intractable pain, numbness or weakness going into your hands, dropping things, trouble breathing, or any other new/concerning symptoms then return to the ER for evaluation.

## 2022-05-24 NOTE — ED Provider Notes (Signed)
I spoke with Dr. Danielle Dess.  Flex ex films are stable.  He recommends NSAIDs, muscle relaxer, 300 mg twice daily gabapentin and follow-up in 1-2 weeks in his office.  Can wear a soft collar for comfort.  Patient denies any weakness and it seems like he is having hyperparesthesias.  Otherwise, we discussed return precautions and will give follow-up information and he can return at any time.   Pricilla Loveless, MD 05/24/22 2105
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
## 2022-05-24 NOTE — ED Notes (Signed)
Patient transported to X-ray 

## 2022-05-24 NOTE — ED Notes (Signed)
Dc instructions and scripts reviewed with pt no questions or concerns at this time. Will follow up with neurosurgery in 1-2 weeks.

## 2022-05-24 NOTE — ED Notes (Signed)
Pt in MRI at this time 

## 2022-06-29 ENCOUNTER — Emergency Department (HOSPITAL_COMMUNITY): Payer: Medicaid Other

## 2022-06-29 ENCOUNTER — Other Ambulatory Visit: Payer: Self-pay

## 2022-06-29 ENCOUNTER — Emergency Department (HOSPITAL_COMMUNITY)
Admission: EM | Admit: 2022-06-29 | Discharge: 2022-06-29 | Disposition: A | Discharge status: Home / Self Care | Payer: Medicaid Other | Attending: Emergency Medicine | Admitting: Emergency Medicine

## 2022-06-29 ENCOUNTER — Encounter (HOSPITAL_COMMUNITY): Payer: Self-pay | Admitting: Emergency Medicine

## 2022-06-29 DIAGNOSIS — R202 Paresthesia of skin: Secondary | ICD-10-CM | POA: Diagnosis not present

## 2022-06-29 DIAGNOSIS — M542 Cervicalgia: Secondary | ICD-10-CM | POA: Insufficient documentation

## 2022-06-29 DIAGNOSIS — R519 Headache, unspecified: Secondary | ICD-10-CM | POA: Diagnosis not present

## 2022-06-29 DIAGNOSIS — G959 Disease of spinal cord, unspecified: Secondary | ICD-10-CM

## 2022-06-29 MED ORDER — OXYCODONE-ACETAMINOPHEN 5-325 MG PO TABS
2.0000 | ORAL_TABLET | Freq: Once | ORAL | Status: AC
  Administered 2022-06-29: 2 via ORAL
  Filled 2022-06-29: qty 2

## 2022-06-29 MED ORDER — DEXAMETHASONE SODIUM PHOSPHATE 10 MG/ML IJ SOLN
10.0000 mg | Freq: Once | INTRAMUSCULAR | Status: AC
  Administered 2022-06-29: 10 mg via INTRAMUSCULAR
  Filled 2022-06-29: qty 1

## 2022-06-29 MED ORDER — IBUPROFEN 600 MG PO TABS: 600.0000 mg | ORAL_TABLET | Freq: Four times a day (QID) | ORAL | 0 refills | Status: AC | PRN

## 2022-06-29 MED ORDER — PREDNISONE 20 MG PO TABS: 40.0000 mg | ORAL_TABLET | Freq: Every day | ORAL | 0 refills | Status: DC

## 2022-06-29 MED ORDER — KETOROLAC TROMETHAMINE 15 MG/ML IJ SOLN
15.0000 mg | Freq: Once | INTRAMUSCULAR | Status: AC
Start: 2022-06-29 — End: 2022-06-29
  Administered 2022-06-29: 15 mg via INTRAMUSCULAR
  Filled 2022-06-29: qty 1

## 2022-06-29 NOTE — Discharge Instructions (Signed)
Please call your neurosurgeon to schedule a follow-up appointment.  Have also given another neurosurgeon to follow-up with in the event you can get a hold of the other 1.  Please take prednisone and ibuprofen as prescribed.  I would take with food as it can upset your stomach.  [place] to the [facility] for any worsening symptoms.
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
## 2022-06-29 NOTE — ED Notes (Signed)
Pt d/c home per MD order, Discharge summary reviewed with pt, pt verbalizes understanding. Ambulatory off unit. No s/s of acute distress noted at discharge, reports daughter is discharge ride home.

## 2022-06-29 NOTE — ED Notes (Signed)
Patient transported to CT 
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
## 2022-07-03 ENCOUNTER — Other Ambulatory Visit: Payer: Self-pay | Admitting: Neurological Surgery

## 2022-07-30 NOTE — Pre-Procedure Instructions (Signed)
Surgical Instructions    Your procedure is scheduled on August 07, 2022.  Report to [facility] Main Entrance "A" at 5:30 A.M., then check in with the Admitting office.  Call this number if you have problems the morning of surgery:  [phone]   If you have any questions prior to your surgery date call [phone]: Open Monday-Friday 8am-4pm    Remember:  Do not eat after midnight the night before your surgery  You may drink clear liquids until 4:30 AM the morning of your surgery.   Clear liquids allowed are: Water, Non-Citrus Juices (without pulp), Carbonated Beverages, Clear Tea, Black Coffee Only (NO MILK, CREAM OR POWDERED CREAMER of any kind), and Gatorade.     Take these medicines the morning of surgery with A SIP OF WATER:  citalopram (CELEXA)   dexamethasone (DECADRON)  gabapentin (NEURONTIN)    As of today, STOP taking any Aspirin (unless otherwise instructed by your surgeon) Aleve, Naproxen, Ibuprofen, Motrin, Advil, Goody's, BC's, all herbal medications, fish oil, and all vitamins.                     Do NOT Smoke (Tobacco/Vaping) for 24 hours prior to your procedure.  If you use a CPAP at night, you may bring your mask/headgear for your overnight stay.   Contacts, glasses, piercing's, hearing aid's, dentures or partials may not be worn into surgery, please bring cases for these belongings.    For patients admitted to the hospital, discharge time will be determined by your treatment team.   Patients discharged the day of surgery will not be allowed to drive home, and someone needs to stay with them for 24 hours.  SURGICAL WAITING ROOM VISITATION Patients having surgery or a procedure may have no more than 2 support people in the waiting area - these visitors may rotate.   Children under the age of 42 must have an adult with them who is not the patient. If the patient needs to stay at the hospital during part of their recovery, the visitor guidelines for  inpatient rooms apply. Pre-op nurse will coordinate an appropriate time for 1 support person to accompany patient in pre-op.  This support person may not rotate.   Please refer to the [facility] website for the visitor guidelines for Inpatients (after your surgery is over and you are in a regular room).    Special instructions:   [facility]- Preparing For Surgery  Before surgery, you can play an important role. Because skin is not sterile, your skin needs to be as free of germs as possible. You can reduce the number of germs on your skin by washing with CHG (chlorahexidine gluconate) Soap before surgery.  CHG is an antiseptic cleaner which kills germs and bonds with the skin to continue killing germs even after washing.    Oral Hygiene is also important to reduce your risk of infection.  Remember - BRUSH YOUR TEETH THE MORNING OF SURGERY WITH YOUR REGULAR TOOTHPASTE  Please do not use if you have an allergy to CHG or antibacterial soaps. If your skin becomes reddened/irritated stop using the CHG.  Do not shave (including legs and underarms) for at least 48 hours prior to first CHG shower. It is OK to shave your face.  Please follow these instructions carefully.   Shower the NIGHT BEFORE SURGERY and the MORNING OF SURGERY  If you chose to wash your hair, wash your hair first as usual with your normal shampoo.  After  you shampoo, rinse your hair and body thoroughly to remove the shampoo.  Use CHG Soap as you would any other liquid soap. You can apply CHG directly to the skin and wash gently with a scrungie or a clean washcloth.   Apply the CHG Soap to your body ONLY FROM THE NECK DOWN.  Do not use on open wounds or open sores. Avoid contact with your eyes, ears, mouth and genitals (private parts). Wash Face and genitals (private parts)  with your normal soap.   Wash thoroughly, paying special attention to the area where your surgery will be performed.  Thoroughly rinse your body with  warm water from the neck down.  DO NOT shower/wash with your normal soap after using and rinsing off the CHG Soap.  Pat yourself dry with a CLEAN TOWEL.  Wear CLEAN PAJAMAS to bed the night before surgery  Place CLEAN SHEETS on your bed the night before your surgery  DO NOT SLEEP WITH PETS.   Day of Surgery: Take a shower with CHG soap. Do not wear jewelry or makeup Do not wear lotions, powders, perfumes/colognes, or deodorant. Do not shave 48 hours prior to surgery.  Men may shave face and neck. Do not bring valuables to the hospital.  [facility] is not responsible for any belongings or valuables. Do not wear nail polish, gel polish, artificial nails, or any other type of covering on natural nails (fingers and toes) If you have artificial nails or gel coating that need to be removed by a nail salon, please have this removed prior to surgery. Artificial nails or gel coating may interfere with anesthesia's ability to adequately monitor your vital signs. Wear Clean/Comfortable clothing the morning of surgery Remember to brush your teeth WITH YOUR REGULAR TOOTHPASTE.   Please read over the following fact sheets that you were given.    If you received a COVID test during your pre-op visit  it is requested that you wear a mask when out in public, stay away from anyone that may not be feeling well and notify your surgeon if you develop symptoms. If you have been in contact with anyone that has tested positive in the last 10 days please notify you surgeon.

## 2022-07-31 ENCOUNTER — Encounter (HOSPITAL_COMMUNITY): Payer: Self-pay

## 2022-07-31 ENCOUNTER — Other Ambulatory Visit: Payer: Self-pay

## 2022-07-31 ENCOUNTER — Encounter (HOSPITAL_COMMUNITY)
Admission: RE | Admit: 2022-07-31 | Discharge: 2022-07-31 | Disposition: A | Discharge status: Home / Self Care | Payer: Medicaid Other | Source: Ambulatory Visit | Attending: Neurological Surgery | Admitting: Neurological Surgery

## 2022-07-31 VITALS — BP 142/96 | HR 69 | Temp 98.3°F | Resp 19 | Ht 72.0 in | Wt 137.1 lb

## 2022-07-31 DIAGNOSIS — F17200 Nicotine dependence, unspecified, uncomplicated: Secondary | ICD-10-CM | POA: Insufficient documentation

## 2022-07-31 DIAGNOSIS — M5430 Sciatica, unspecified side: Secondary | ICD-10-CM | POA: Diagnosis not present

## 2022-07-31 DIAGNOSIS — M4712 Other spondylosis with myelopathy, cervical region: Secondary | ICD-10-CM | POA: Diagnosis not present

## 2022-07-31 DIAGNOSIS — Z01812 Encounter for preprocedural laboratory examination: Secondary | ICD-10-CM | POA: Diagnosis present

## 2022-07-31 DIAGNOSIS — R609 Edema, unspecified: Secondary | ICD-10-CM | POA: Insufficient documentation

## 2022-07-31 DIAGNOSIS — G8929 Other chronic pain: Secondary | ICD-10-CM | POA: Diagnosis not present

## 2022-07-31 DIAGNOSIS — J449 Chronic obstructive pulmonary disease, unspecified: Secondary | ICD-10-CM | POA: Diagnosis not present

## 2022-07-31 DIAGNOSIS — Z01818 Encounter for other preprocedural examination: Secondary | ICD-10-CM

## 2022-07-31 LAB — CBC
HCT: 46.5 % (ref 39.0–52.0)
Hemoglobin: 15.5 g/dL (ref 13.0–17.0)
MCH: 30.3 pg (ref 26.0–34.0)
MCHC: 33.3 g/dL (ref 30.0–36.0)
MCV: 91 fL (ref 80.0–100.0)
Platelets: 272 10*3/uL (ref 150–400)
RBC: 5.11 MIL/uL (ref 4.22–5.81)
RDW: 15.9 % — ABNORMAL HIGH (ref 11.5–15.5)
WBC: 16.8 10*3/uL — ABNORMAL HIGH (ref 4.0–10.5)
nRBC: 0 % (ref 0.0–0.2)

## 2022-07-31 LAB — TYPE AND SCREEN
ABO/RH(D): O POS
Antibody Screen: NEGATIVE

## 2022-07-31 LAB — SURGICAL PCR SCREEN
MRSA, PCR: NEGATIVE
Staphylococcus aureus: NEGATIVE

## 2022-07-31 NOTE — Progress Notes (Addendum)
PCP - No PCP Cardiologist - Denies  PPM/ICD - Denies  Chest x-ray - 05/24/22 EKG - 10/12/19 Stress Test - Denies ECHO - Denies Cardiac Cath - Denies  Sleep Study - Denies  DM - Denies  ERAS Protcol -Yes  COVID TEST- NI   Anesthesia review: Yes WBC elevated.  Patient denies shortness of breath, fever, cough and chest pain at PAT appointment. Patient did state that he had start some dayquil/nyquil for some congestion.  I told him that if his congestion worsens and he develops a fever to make his surgeon aware.    All instructions explained to the patient, with a verbal understanding of the material. Patient agrees to go over the instructions while at home for a better understanding.  The opportunity to ask questions was provided.

## 2022-07-31 NOTE — Progress Notes (Signed)
1110 Called to check on patient who has not arrived for his pre admission testing appt. Patient stated he was walking in the door.

## 2022-08-01 NOTE — Anesthesia Preprocedure Evaluation (Addendum)
Anesthesia Evaluation  Patient identified by MRN, date of birth, ID band Patient awake    Reviewed: Allergy & Precautions, NPO status , Patient's Chart, lab work & pertinent test results  Airway Mallampati: I  TM Distance: >3 FB Neck ROM: Full    Dental  (+) Edentulous Upper, Edentulous Lower   Pulmonary COPD, Current Smoker and Patient abstained from smoking.,     + decreased breath sounds      Cardiovascular negative cardio ROS   Rhythm:Regular Rate:Normal     Neuro/Psych negative neurological ROS  negative psych ROS   GI/Hepatic negative GI ROS, Neg liver ROS,   Endo/Other  negative endocrine ROS  Renal/GU negative Renal ROS     Musculoskeletal  (+) Arthritis ,   Abdominal Normal abdominal exam  (+)   Peds  Hematology negative hematology ROS (+)   Anesthesia Other Findings   Reproductive/Obstetrics                           Anesthesia Physical Anesthesia Plan  ASA: 3  Anesthesia Plan: General   Post-op Pain Management:    Induction: Intravenous  PONV Risk Score and Plan: 2 and Ondansetron, Dexamethasone and Midazolam  Airway Management Planned: Oral ETT and Video Laryngoscope Planned  Additional Equipment: None  Intra-op Plan:   Post-operative Plan: Extubation in OR  Informed Consent: I have reviewed the patients History and Physical, chart, labs and discussed the procedure including the risks, benefits and alternatives for the proposed anesthesia with the patient or authorized representative who has indicated his/her understanding and acceptance.     Dental advisory given  Plan Discussed with: CRNA  Anesthesia Plan Comments: (PAT note written 08/01/2022 by Myra Gianotti, PA-C. )      Anesthesia Quick Evaluation
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
## 2022-08-02 ENCOUNTER — Emergency Department (HOSPITAL_COMMUNITY)
Admission: EM | Admit: 2022-08-02 | Discharge: 2022-08-02 | Disposition: A | Discharge status: Home / Self Care | Payer: Medicaid Other | Attending: Emergency Medicine | Admitting: Emergency Medicine

## 2022-08-02 ENCOUNTER — Other Ambulatory Visit: Payer: Self-pay

## 2022-08-02 ENCOUNTER — Encounter (HOSPITAL_COMMUNITY): Payer: Self-pay | Admitting: *Deleted

## 2022-08-02 ENCOUNTER — Emergency Department (HOSPITAL_COMMUNITY): Payer: Medicaid Other

## 2022-08-02 DIAGNOSIS — R051 Acute cough: Secondary | ICD-10-CM

## 2022-08-02 DIAGNOSIS — J441 Chronic obstructive pulmonary disease with (acute) exacerbation: Secondary | ICD-10-CM | POA: Diagnosis not present

## 2022-08-02 MED ORDER — DOXYCYCLINE HYCLATE 100 MG PO CAPS: 100.0000 mg | ORAL_CAPSULE | Freq: Two times a day (BID) | ORAL | 0 refills | Status: DC

## 2022-08-02 NOTE — ED Triage Notes (Signed)
Pt c/o cough and thinks he has pneumonia

## 2022-08-02 NOTE — ED Provider Notes (Signed)
[facility] EMERGENCY DEPARTMENT Provider Note   CSN: [id number] Arrival date & time: 08/02/22  0935     History Chief Complaint  Patient presents with   Cough    William Steele is a 56 y.o. male patient with history of COPD who presents to the emergency department today for further evaluation of a 2-day history of cough and increased sputum production and frequency and change in color to green.  He denies any fevers or chills.  He is having some pain in the left lower thoracic back.  He states this typically happens when he is diagnosed with pneumonia.   Cough      Home Medications Prior to Admission medications   Medication Sig Start Date End Date Taking? Authorizing Provider  doxycycline (VIBRAMYCIN) 100 MG capsule Take 1 capsule (100 mg total) by mouth 2 (two) times daily. 08/02/22  Yes Raul Del, Oney Tatlock M, PA-C  citalopram (CELEXA) 20 MG tablet Take 20 mg by mouth daily. 07/25/22   [provider]  dexamethasone (DECADRON) 4 MG tablet Take 2 mg by mouth 2 (two) times daily with a meal.    [provider]  gabapentin (NEURONTIN) 300 MG capsule Take 1 capsule (300 mg total) by mouth 2 (two) times daily for 14 days. 05/24/22 07/25/22  Sherwood Gambler, MD  ibuprofen (ADVIL) 600 MG tablet Take 1 tablet (600 mg total) by mouth every 6 (six) hours as needed. 06/29/22   Myna Bright M, PA-C  methocarbamol (ROBAXIN) 500 MG tablet Take 1 tablet (500 mg total) by mouth every 8 (eight) hours as needed for muscle spasms. Patient not taking: Reported on 07/25/2022 05/24/22   Sherwood Gambler, MD  naproxen (NAPROSYN) 375 MG tablet Take 1 tablet (375 mg total) by mouth 2 (two) times daily. Patient not taking: Reported on 07/25/2022 05/24/22   Sherwood Gambler, MD  traZODone (DESYREL) 100 MG tablet Take 100 mg by mouth at bedtime. 07/25/22   [provider]      Allergies    Patient has no known allergies.    Review of Systems   Review of Systems  Respiratory:  Positive  for cough.   All other systems reviewed and are negative.   Physical Exam Updated Vital Signs BP (!) 123/92 (BP Location: Right Arm)   Pulse 77   Temp 98.1 F (36.7 C) (Oral)   Resp (!) 24   Ht 6' (1.829 m)   Wt 62.2 kg   SpO2 90%   BMI 18.59 kg/m  Physical Exam Vitals and nursing note reviewed.  Constitutional:      General: He is not in acute distress.    Appearance: Normal appearance.  HENT:     Head: Normocephalic and atraumatic.  Eyes:     General:        Right eye: No discharge.        Left eye: No discharge.  Cardiovascular:     Comments: Regular rate and rhythm.  S1/S2 are distinct without any evidence of murmur, rubs, or gallops.  Radial pulses are 2+ bilaterally.  Dorsalis pedis pulses are 2+ bilaterally.  No evidence of pedal edema. Pulmonary:     Comments: Clear to auscultation bilaterally.  Normal effort.  No respiratory distress.  No evidence of wheezes, rales, or rhonchi heard throughout. Abdominal:     General: Abdomen is flat. Bowel sounds are normal. There is no distension.     Tenderness: There is no abdominal tenderness. There is no guarding or rebound.  Musculoskeletal:  General: Normal range of motion.     Cervical back: Neck supple.  Skin:    General: Skin is warm and dry.     Findings: No rash.  Neurological:     General: No focal deficit present.     Mental Status: He is alert.  Psychiatric:        Mood and Affect: Mood normal.        Behavior: Behavior normal.     ED Results / Procedures / Treatments   Labs (all labs ordered are listed, but only abnormal results are displayed) Labs Reviewed - No data to display  EKG None  Radiology DG Chest 2 View  Result Date: 08/02/2022 CLINICAL DATA:  Cough and shortness of breath for 2-3 days. EXAM: CHEST - 2 VIEW COMPARISON:  Chest two views 05/24/2022 FINDINGS: Cardiac silhouette and mediastinal contours are within normal limits. Mild calcification within the aortic arch. Flattening of  the diaphragms and moderate hyperinflation, unchanged. Increased lucencies within the bilateral upper lungs are unchanged and consistent with the moderate cystic emphysematous changes seen on prior CT. Additional linear density within the right upper lung corresponding to the large bulla within the right lung apex, with decreased more superior pulmonary vascular markings on radiograph. No focal airspace opacity to indicate pneumonia. No pleural effusion or pneumothorax. Mild multilevel degenerative disc changes of the thoracic spine. IMPRESSION: Chronic hyperinflation, emphysematous changes, and large right apical bulla, unchanged. No acute pneumonia. Electronically Signed   By: Yvonne Kendall M.D.   On: 08/02/2022 10:56    Procedures Procedures    Medications Ordered in ED Medications - No data to display  ED Course/ Medical Decision Making/ A&P Clinical Course as of 08/02/22 1250  Thu Aug 02, 2022  1249 DG Chest 2 View I personally interpreted this chest x-ray and do not see any evidence of pneumonia. [CF]    Clinical Course User Index [CF] Hendricks Limes, PA-C                           Medical Decision Making William Steele is a 56 y.o. male patient who presents to the emergency department today for further evaluation of cough.  Patient was resting comfortably when I saw him on exam.  He was in no respiratory distress.  Patient coughing intermittently during exam.  Chest x-ray was ordered in triage and interpreted by myself.  I do not see any evidence of pneumonia.  However, given that the patient is having increased frequency and is now having green productive sputum I will cover him with short course of doxycycline given his history of COPD.  Patient amenable this plan.  Strict return precautions were discussed.  He is safe for discharge.   Amount and/or Complexity of Data Reviewed Radiology: ordered.  Risk Prescription drug management.   Final Clinical Impression(s) / ED  Diagnoses Final diagnoses:  Acute cough  COPD exacerbation ([facility])    Rx / DC Orders ED Discharge Orders          Ordered    doxycycline (VIBRAMYCIN) 100 MG capsule  2 times daily        08/02/22 1246              Myna Bright [place], [place] 08/02/22 1250    Lajean Saver, MD 08/02/22 1511

## 2022-08-02 NOTE — Discharge Instructions (Signed)
Please take antibiotics as prescribed.  You should be good for your surgery on Tuesday.  As we discussed, no evidence of pneumonia on chest x-ray.  Please return to the emergency department for any worsening symptoms.

## 2022-08-07 ENCOUNTER — Inpatient Hospital Stay (HOSPITAL_COMMUNITY): Payer: Medicaid Other

## 2022-08-07 ENCOUNTER — Inpatient Hospital Stay (HOSPITAL_COMMUNITY)
Admission: RE | Admit: 2022-08-07 | Discharge: 2022-08-08 | DRG: 029 | Disposition: A | Discharge status: Home / Self Care | Payer: Medicaid Other | Attending: Neurological Surgery | Admitting: Neurological Surgery

## 2022-08-07 ENCOUNTER — Inpatient Hospital Stay (HOSPITAL_COMMUNITY)
Admission: RE | Disposition: A | Discharge status: Home / Self Care | Payer: Self-pay | Source: Home / Self Care | Attending: Neurological Surgery

## 2022-08-07 ENCOUNTER — Inpatient Hospital Stay (HOSPITAL_COMMUNITY): Payer: Medicaid Other | Admitting: Vascular Surgery

## 2022-08-07 ENCOUNTER — Encounter (HOSPITAL_COMMUNITY): Payer: Self-pay | Admitting: Neurological Surgery

## 2022-08-07 ENCOUNTER — Other Ambulatory Visit: Payer: Self-pay

## 2022-08-07 ENCOUNTER — Inpatient Hospital Stay (HOSPITAL_COMMUNITY): Payer: Medicaid Other | Admitting: Anesthesiology

## 2022-08-07 DIAGNOSIS — M4722 Other spondylosis with radiculopathy, cervical region: Secondary | ICD-10-CM

## 2022-08-07 DIAGNOSIS — Z79899 Other long term (current) drug therapy: Secondary | ICD-10-CM

## 2022-08-07 DIAGNOSIS — J449 Chronic obstructive pulmonary disease, unspecified: Secondary | ICD-10-CM | POA: Diagnosis not present

## 2022-08-07 DIAGNOSIS — S14123A Central cord syndrome at C3 level of cervical spinal cord, initial encounter: Secondary | ICD-10-CM | POA: Diagnosis present

## 2022-08-07 DIAGNOSIS — M5412 Radiculopathy, cervical region: Principal | ICD-10-CM | POA: Diagnosis present

## 2022-08-07 DIAGNOSIS — M4712 Other spondylosis with myelopathy, cervical region: Secondary | ICD-10-CM | POA: Diagnosis present

## 2022-08-07 DIAGNOSIS — W16122A Fall into natural body of water striking bottom causing other injury, initial encounter: Secondary | ICD-10-CM | POA: Diagnosis present

## 2022-08-07 DIAGNOSIS — F17210 Nicotine dependence, cigarettes, uncomplicated: Secondary | ICD-10-CM | POA: Diagnosis present

## 2022-08-07 DIAGNOSIS — G959 Disease of spinal cord, unspecified: Principal | ICD-10-CM | POA: Diagnosis present

## 2022-08-07 DIAGNOSIS — M1990 Unspecified osteoarthritis, unspecified site: Secondary | ICD-10-CM

## 2022-08-07 DIAGNOSIS — Y92838 Other recreation area as the place of occurrence of the external cause: Secondary | ICD-10-CM

## 2022-08-07 HISTORY — PX: ANTERIOR CERVICAL DECOMPRESSION/DISCECTOMY FUSION 4 LEVELS: SHX5556

## 2022-08-07 LAB — ABO/RH: ABO/RH(D): O POS

## 2022-08-07 SURGERY — ANTERIOR CERVICAL DECOMPRESSION/DISCECTOMY FUSION 4 LEVELS
Anesthesia: General | Site: Spine Cervical

## 2022-08-07 MED ORDER — ROCURONIUM BROMIDE 10 MG/ML (PF) SYRINGE
PREFILLED_SYRINGE | INTRAVENOUS | Status: DC | PRN
  Administered 2022-08-07: 20 mg via INTRAVENOUS
  Administered 2022-08-07: 60 mg via INTRAVENOUS
  Administered 2022-08-07 (×2): 20 mg via INTRAVENOUS

## 2022-08-07 MED ORDER — OXYCODONE-ACETAMINOPHEN 5-325 MG PO TABS
1.0000 | ORAL_TABLET | ORAL | Status: DC | PRN
  Administered 2022-08-07 – 2022-08-08 (×3): 2 via ORAL
  Filled 2022-08-07 (×3): qty 2

## 2022-08-07 MED ORDER — BISACODYL 10 MG RE SUPP: 10.0000 mg | Freq: Every day | RECTAL | Status: DC | PRN

## 2022-08-07 MED ORDER — LIDOCAINE 2% (20 MG/ML) 5 ML SYRINGE
INTRAMUSCULAR | Status: DC | PRN
  Administered 2022-08-07: 70 mg via INTRAVENOUS
  Administered 2022-08-07: 30 mg via INTRAVENOUS

## 2022-08-07 MED ORDER — PHENOL 1.4 % MT LIQD: 1.0000 | OROMUCOSAL | Status: DC | PRN

## 2022-08-07 MED ORDER — LIDOCAINE-EPINEPHRINE 1 %-1:100000 IJ SOLN
INTRAMUSCULAR | Status: AC
  Filled 2022-08-07: qty 1

## 2022-08-07 MED ORDER — METHOCARBAMOL 500 MG PO TABS: 500.0000 mg | ORAL_TABLET | Freq: Three times a day (TID) | ORAL | Status: DC | PRN

## 2022-08-07 MED ORDER — PROPOFOL 10 MG/ML IV BOLUS
INTRAVENOUS | Status: AC
  Filled 2022-08-07: qty 20

## 2022-08-07 MED ORDER — ONDANSETRON HCL 4 MG/2ML IJ SOLN
INTRAMUSCULAR | Status: AC
  Filled 2022-08-07: qty 2

## 2022-08-07 MED ORDER — NICOTINE 21 MG/24HR TD PT24
21.0000 mg | MEDICATED_PATCH | Freq: Every day | TRANSDERMAL | Status: DC
  Administered 2022-08-07: 21 mg via TRANSDERMAL
  Filled 2022-08-07: qty 1

## 2022-08-07 MED ORDER — METHOCARBAMOL 500 MG PO TABS
500.0000 mg | ORAL_TABLET | Freq: Four times a day (QID) | ORAL | Status: DC | PRN
  Administered 2022-08-07 – 2022-08-08 (×2): 500 mg via ORAL
  Filled 2022-08-07 (×2): qty 1

## 2022-08-07 MED ORDER — THROMBIN 5000 UNITS EX SOLR
OROMUCOSAL | Status: DC | PRN
  Administered 2022-08-07 (×2): 5 mL via TOPICAL

## 2022-08-07 MED ORDER — FENTANYL CITRATE (PF) 250 MCG/5ML IJ SOLN
INTRAMUSCULAR | Status: DC | PRN
  Administered 2022-08-07: 200 ug via INTRAVENOUS
  Administered 2022-08-07: 50 ug via INTRAVENOUS

## 2022-08-07 MED ORDER — ONDANSETRON HCL 4 MG/2ML IJ SOLN: 4.0000 mg | Freq: Four times a day (QID) | INTRAMUSCULAR | Status: DC | PRN

## 2022-08-07 MED ORDER — CEFAZOLIN SODIUM-DEXTROSE 2-4 GM/100ML-% IV SOLN
2.0000 g | INTRAVENOUS | Status: AC
  Administered 2022-08-07: 2 g via INTRAVENOUS
  Filled 2022-08-07: qty 100

## 2022-08-07 MED ORDER — ACETAMINOPHEN 10 MG/ML IV SOLN: 1000.0000 mg | Freq: Once | INTRAVENOUS | Status: DC | PRN

## 2022-08-07 MED ORDER — THROMBIN 5000 UNITS EX SOLR
CUTANEOUS | Status: AC
  Filled 2022-08-07: qty 5000

## 2022-08-07 MED ORDER — MIDAZOLAM HCL 2 MG/2ML IJ SOLN
INTRAMUSCULAR | Status: DC | PRN
  Administered 2022-08-07: 2 mg via INTRAVENOUS

## 2022-08-07 MED ORDER — LIDOCAINE 2% (20 MG/ML) 5 ML SYRINGE
INTRAMUSCULAR | Status: AC
  Filled 2022-08-07: qty 5

## 2022-08-07 MED ORDER — FENTANYL CITRATE (PF) 250 MCG/5ML IJ SOLN
INTRAMUSCULAR | Status: AC
  Filled 2022-08-07: qty 5

## 2022-08-07 MED ORDER — SODIUM CHLORIDE 0.9% FLUSH: 3.0000 mL | INTRAVENOUS | Status: DC | PRN

## 2022-08-07 MED ORDER — ACETAMINOPHEN 325 MG PO TABS
ORAL_TABLET | ORAL | Status: AC
  Filled 2022-08-07: qty 2

## 2022-08-07 MED ORDER — AMISULPRIDE (ANTIEMETIC) 5 MG/2ML IV SOLN: 10.0000 mg | Freq: Once | INTRAVENOUS | Status: DC | PRN

## 2022-08-07 MED ORDER — BUPIVACAINE HCL (PF) 0.5 % IJ SOLN
INTRAMUSCULAR | Status: DC | PRN
  Administered 2022-08-07: 4.5 mL

## 2022-08-07 MED ORDER — METHOCARBAMOL 1000 MG/10ML IJ SOLN: 500.0000 mg | Freq: Four times a day (QID) | INTRAVENOUS | Status: DC | PRN

## 2022-08-07 MED ORDER — HYDROMORPHONE HCL 1 MG/ML IJ SOLN: 0.2500 mg | INTRAMUSCULAR | Status: DC | PRN

## 2022-08-07 MED ORDER — SODIUM CHLORIDE 0.9 % IV SOLN: 250.0000 mL | INTRAVENOUS | Status: DC

## 2022-08-07 MED ORDER — TRAZODONE HCL 50 MG PO TABS
100.0000 mg | ORAL_TABLET | Freq: Every day | ORAL | Status: DC
  Administered 2022-08-07: 100 mg via ORAL
  Filled 2022-08-07: qty 2

## 2022-08-07 MED ORDER — ONDANSETRON HCL 4 MG PO TABS: 4.0000 mg | ORAL_TABLET | Freq: Four times a day (QID) | ORAL | Status: DC | PRN

## 2022-08-07 MED ORDER — ACETAMINOPHEN 325 MG PO TABS
325.0000 mg | ORAL_TABLET | Freq: Once | ORAL | Status: AC | PRN
  Administered 2022-08-07: 650 mg via ORAL

## 2022-08-07 MED ORDER — CEFAZOLIN SODIUM-DEXTROSE 2-4 GM/100ML-% IV SOLN
2.0000 g | Freq: Three times a day (TID) | INTRAVENOUS | Status: AC
  Administered 2022-08-07 (×2): 2 g via INTRAVENOUS
  Filled 2022-08-07 (×2): qty 100

## 2022-08-07 MED ORDER — EPHEDRINE SULFATE-NACL 50-0.9 MG/10ML-% IV SOSY
PREFILLED_SYRINGE | INTRAVENOUS | Status: DC | PRN
  Administered 2022-08-07: 5 mg via INTRAVENOUS

## 2022-08-07 MED ORDER — SENNA 8.6 MG PO TABS
1.0000 | ORAL_TABLET | Freq: Two times a day (BID) | ORAL | Status: DC
  Administered 2022-08-07: 8.6 mg via ORAL
  Filled 2022-08-07: qty 1

## 2022-08-07 MED ORDER — MORPHINE SULFATE (PF) 2 MG/ML IV SOLN: 2.0000 mg | INTRAVENOUS | Status: DC | PRN

## 2022-08-07 MED ORDER — CHLORHEXIDINE GLUCONATE CLOTH 2 % EX PADS: 6.0000 | MEDICATED_PAD | Freq: Once | CUTANEOUS | Status: DC

## 2022-08-07 MED ORDER — ACETAMINOPHEN 650 MG RE SUPP: 650.0000 mg | RECTAL | Status: DC | PRN

## 2022-08-07 MED ORDER — PROPOFOL 10 MG/ML IV BOLUS
INTRAVENOUS | Status: DC | PRN
  Administered 2022-08-07: 100 mg via INTRAVENOUS

## 2022-08-07 MED ORDER — DEXAMETHASONE SODIUM PHOSPHATE 10 MG/ML IJ SOLN
INTRAMUSCULAR | Status: AC
  Filled 2022-08-07: qty 1

## 2022-08-07 MED ORDER — SODIUM CHLORIDE 0.9% FLUSH: 3.0000 mL | Freq: Two times a day (BID) | INTRAVENOUS | Status: DC

## 2022-08-07 MED ORDER — ORAL CARE MOUTH RINSE: 15.0000 mL | Freq: Once | OROMUCOSAL | Status: AC

## 2022-08-07 MED ORDER — ACETAMINOPHEN 160 MG/5ML PO SOLN: 325.0000 mg | Freq: Once | ORAL | Status: AC | PRN

## 2022-08-07 MED ORDER — PHENYLEPHRINE HCL-NACL 20-0.9 MG/250ML-% IV SOLN
INTRAVENOUS | Status: DC | PRN
  Administered 2022-08-07: 25 ug/min via INTRAVENOUS

## 2022-08-07 MED ORDER — DOXYCYCLINE HYCLATE 100 MG PO TABS
100.0000 mg | ORAL_TABLET | Freq: Two times a day (BID) | ORAL | Status: DC
  Administered 2022-08-07: 100 mg via ORAL
  Filled 2022-08-07 (×3): qty 1

## 2022-08-07 MED ORDER — MENTHOL 3 MG MT LOZG: 1.0000 | LOZENGE | OROMUCOSAL | Status: DC | PRN

## 2022-08-07 MED ORDER — LACTATED RINGERS IV SOLN: INTRAVENOUS | Status: DC

## 2022-08-07 MED ORDER — PROMETHAZINE HCL 25 MG/ML IJ SOLN: 6.2500 mg | INTRAMUSCULAR | Status: DC | PRN

## 2022-08-07 MED ORDER — FLEET ENEMA 7-19 GM/118ML RE ENEM: 1.0000 | ENEMA | Freq: Once | RECTAL | Status: DC | PRN

## 2022-08-07 MED ORDER — SUGAMMADEX SODIUM 200 MG/2ML IV SOLN
INTRAVENOUS | Status: DC | PRN
  Administered 2022-08-07: 200 mg via INTRAVENOUS

## 2022-08-07 MED ORDER — BUPIVACAINE HCL (PF) 0.5 % IJ SOLN
INTRAMUSCULAR | Status: AC
  Filled 2022-08-07: qty 30

## 2022-08-07 MED ORDER — GABAPENTIN 300 MG PO CAPS
300.0000 mg | ORAL_CAPSULE | Freq: Two times a day (BID) | ORAL | Status: DC
  Administered 2022-08-07: 300 mg via ORAL
  Filled 2022-08-07: qty 1

## 2022-08-07 MED ORDER — GLYCOPYRROLATE PF 0.2 MG/ML IJ SOSY
PREFILLED_SYRINGE | INTRAMUSCULAR | Status: AC
  Filled 2022-08-07: qty 1

## 2022-08-07 MED ORDER — MIDAZOLAM HCL 2 MG/2ML IJ SOLN
INTRAMUSCULAR | Status: AC
  Filled 2022-08-07: qty 2

## 2022-08-07 MED ORDER — DOCUSATE SODIUM 100 MG PO CAPS
100.0000 mg | ORAL_CAPSULE | Freq: Two times a day (BID) | ORAL | Status: DC
  Administered 2022-08-07: 100 mg via ORAL
  Filled 2022-08-07: qty 1

## 2022-08-07 MED ORDER — ROCURONIUM BROMIDE 10 MG/ML (PF) SYRINGE
PREFILLED_SYRINGE | INTRAVENOUS | Status: AC
  Filled 2022-08-07: qty 30

## 2022-08-07 MED ORDER — CHLORHEXIDINE GLUCONATE 0.12 % MT SOLN
15.0000 mL | Freq: Once | OROMUCOSAL | Status: AC
  Administered 2022-08-07: 15 mL via OROMUCOSAL
  Filled 2022-08-07: qty 15

## 2022-08-07 MED ORDER — DEXAMETHASONE SODIUM PHOSPHATE 10 MG/ML IJ SOLN
INTRAMUSCULAR | Status: DC | PRN
  Administered 2022-08-07: 10 mg via INTRAVENOUS

## 2022-08-07 MED ORDER — EPHEDRINE 5 MG/ML INJ
INTRAVENOUS | Status: AC
  Filled 2022-08-07: qty 5

## 2022-08-07 MED ORDER — LIDOCAINE-EPINEPHRINE 1 %-1:100000 IJ SOLN
INTRAMUSCULAR | Status: DC | PRN
  Administered 2022-08-07: 4.5 mL

## 2022-08-07 MED ORDER — ACETAMINOPHEN 325 MG PO TABS: 650.0000 mg | ORAL_TABLET | ORAL | Status: DC | PRN

## 2022-08-07 MED ORDER — GLYCOPYRROLATE 0.2 MG/ML IJ SOLN
INTRAMUSCULAR | Status: DC | PRN
  Administered 2022-08-07: .2 mg via INTRAVENOUS

## 2022-08-07 MED ORDER — 0.9 % SODIUM CHLORIDE (POUR BTL) OPTIME
TOPICAL | Status: DC | PRN
  Administered 2022-08-07: 1000 mL

## 2022-08-07 MED ORDER — MEPERIDINE HCL 25 MG/ML IJ SOLN: 6.2500 mg | INTRAMUSCULAR | Status: DC | PRN

## 2022-08-07 MED ORDER — LACTATED RINGERS IV SOLN: INTRAVENOUS | Status: DC | PRN

## 2022-08-07 MED ORDER — ONDANSETRON HCL 4 MG/2ML IJ SOLN
INTRAMUSCULAR | Status: DC | PRN
  Administered 2022-08-07: 4 mg via INTRAVENOUS

## 2022-08-07 MED ORDER — CITALOPRAM HYDROBROMIDE 20 MG PO TABS
20.0000 mg | ORAL_TABLET | Freq: Every day | ORAL | Status: DC
  Administered 2022-08-07: 20 mg via ORAL
  Filled 2022-08-07: qty 1

## 2022-08-07 MED ORDER — POLYETHYLENE GLYCOL 3350 17 G PO PACK: 17.0000 g | PACK | Freq: Every day | ORAL | Status: DC | PRN

## 2022-08-07 SURGICAL SUPPLY — 57 items
ADH SKN CLS APL DERMABOND .7 (GAUZE/BANDAGES/DRESSINGS) ×1
BAG COUNTER SPONGE SURGICOUNT (BAG) ×2 IMPLANT
BAG SPNG CNTER NS LX DISP (BAG) ×1
BAND INSRT 18 STRL LF DISP RB (MISCELLANEOUS)
BAND RUBBER #18 3X1/16 STRL (MISCELLANEOUS) IMPLANT
BIT DRILL ACP 15 (DRILL) IMPLANT
BIT DRILL NEURO 2X3.1 SFT TUCH (MISCELLANEOUS) ×2 IMPLANT
BNDG GAUZE DERMACEA FLUFF 4 (GAUZE/BANDAGES/DRESSINGS) IMPLANT
BNDG GZE DERMACEA 4 6PLY (GAUZE/BANDAGES/DRESSINGS)
BUR BARREL STRAIGHT FLUTE 4.0 (BURR) IMPLANT
CAGE CERV MOD 6X15X12 7D (Cage) IMPLANT
CAGE CERV MOD 6X17X14 7D (Cage) IMPLANT
CANISTER SUCT 3000ML PPV (MISCELLANEOUS) ×2 IMPLANT
DERMABOND ADVANCED .7 DNX12 (GAUZE/BANDAGES/DRESSINGS) ×2 IMPLANT
DRAPE LAPAROTOMY 100X72 PEDS (DRAPES) ×2 IMPLANT
DRAPE MICROSCOPE SLANT 54X150 (MISCELLANEOUS) IMPLANT
DRILL ACP 15 (DRILL) ×1
DRILL NEURO 2X3.1 SOFT TOUCH (MISCELLANEOUS) ×1
DRSG OPSITE POSTOP 3X4 (GAUZE/BANDAGES/DRESSINGS) IMPLANT
DURAPREP 6ML APPLICATOR 50/CS (WOUND CARE) ×2 IMPLANT
ELECT COATED BLADE 2.86 ST (ELECTRODE) ×2 IMPLANT
ELECT REM PT RETURN 9FT ADLT (ELECTROSURGICAL) ×1
ELECTRODE REM PT RTRN 9FT ADLT (ELECTROSURGICAL) ×2 IMPLANT
EVACUATOR 1/8 PVC DRAIN (DRAIN) IMPLANT
GAUZE 4X4 16PLY ~~LOC~~+RFID DBL (SPONGE) IMPLANT
GLOVE BIOGEL PI IND STRL 7.0 (GLOVE) IMPLANT
GLOVE BIOGEL PI IND STRL 8.5 (GLOVE) ×2 IMPLANT
GLOVE ECLIPSE 8.5 STRL (GLOVE) ×2 IMPLANT
GLOVE EXAM NITRILE XL STR (GLOVE) IMPLANT
GLOVE SURG UNDER POLY LF SZ7 (GLOVE) IMPLANT
GOWN STRL REUS W/ TWL LRG LVL3 (GOWN DISPOSABLE) IMPLANT
GOWN STRL REUS W/ TWL XL LVL3 (GOWN DISPOSABLE) ×2 IMPLANT
GOWN STRL REUS W/TWL 2XL LVL3 (GOWN DISPOSABLE) ×2 IMPLANT
GOWN STRL REUS W/TWL LRG LVL3 (GOWN DISPOSABLE) ×2
GOWN STRL REUS W/TWL XL LVL3 (GOWN DISPOSABLE) ×1
HALTER HD/CHIN CERV TRACTION D (MISCELLANEOUS) ×2 IMPLANT
HEMOSTAT POWDER KIT SURGIFOAM (HEMOSTASIS) ×2 IMPLANT
KIT BASIN OR (CUSTOM PROCEDURE TRAY) ×2 IMPLANT
KIT TURNOVER KIT B (KITS) ×2 IMPLANT
NDL SPNL 22GX3.5 QUINCKE BK (NEEDLE) ×2 IMPLANT
NEEDLE HYPO 22GX1.5 SAFETY (NEEDLE) ×2 IMPLANT
NEEDLE SPNL 22GX3.5 QUINCKE BK (NEEDLE) ×1 IMPLANT
NS IRRIG 1000ML POUR BTL (IV SOLUTION) ×2 IMPLANT
PACK LAMINECTOMY NEURO (CUSTOM PROCEDURE TRAY) ×2 IMPLANT
PAD ARMBOARD 7.5X6 YLW CONV (MISCELLANEOUS) ×6 IMPLANT
PATTIES SURGICAL .5 X1 (DISPOSABLE) ×2 IMPLANT
PIN ACP TEMP FIXATION (EXFIX) IMPLANT
PLATE ACP 2.1X74 4LVL (Plate) IMPLANT
PUTTY DBM PROPEL MEDIUM (Putty) IMPLANT
SCREW ACP VA ST 3.5X15 (Screw) IMPLANT
SET WALTER ACTIVATION W/DRAPE (SET/KITS/TRAYS/PACK) ×2 IMPLANT
SPIKE FLUID TRANSFER (MISCELLANEOUS) ×2 IMPLANT
SPONGE INTESTINAL PEANUT (DISPOSABLE) ×2 IMPLANT
SUT VIC AB 4-0 RB1 18 (SUTURE) ×4 IMPLANT
TOWEL GREEN STERILE (TOWEL DISPOSABLE) ×2 IMPLANT
TOWEL GREEN STERILE FF (TOWEL DISPOSABLE) ×2 IMPLANT
WATER STERILE IRR 1000ML POUR (IV SOLUTION) ×2 IMPLANT

## 2022-08-07 NOTE — Transfer of Care (Signed)
Immediate Anesthesia Transfer of Care Note  Patient: William Steele  Procedure(s) Performed: Cervical three-four Cervical four-five Cervical five-six Cervical six-seven Anterior Cervical Decompression Fusion (Spine Cervical)  Patient Location: PACU  Anesthesia Type:General  Level of Consciousness: awake, alert  and oriented  Airway & Oxygen Therapy: Patient Spontanous Breathing and Patient connected to nasal cannula oxygen  Post-op Assessment: Report given to RN and Post -op Vital signs reviewed and stable  Post vital signs: Reviewed and stable  Last Vitals:  Vitals Value Taken Time  BP 148/95 08/07/22 1209  Temp    Pulse 101 08/07/22 1214  Resp 13 08/07/22 1214  SpO2 94 % 08/07/22 1214  Vitals shown include unvalidated device data.  Last Pain:  Vitals:   08/07/22 0602  TempSrc:   PainSc: 6       Patients Stated Pain Goal: 3 (65/03/[birth year] 6568)  Complications: No notable events documented.

## 2022-08-07 NOTE — Anesthesia Procedure Notes (Signed)
Procedure Name: Intubation Date/Time: 08/07/2022 8:11 AM  Performed by: Mariea Clonts, CRNAPre-anesthesia Checklist: Patient identified, Emergency Drugs available, Suction available and Patient being monitored Patient Re-evaluated:Patient Re-evaluated prior to induction Oxygen Delivery Method: Circle System Utilized Preoxygenation: Pre-oxygenation with 100% oxygen Induction Type: IV induction Ventilation: Mask ventilation without difficulty Laryngoscope Size: Glidescope and 4 Grade View: Grade I Tube type: Oral Tube size: 7.5 mm Number of attempts: 1 Airway Equipment and Method: Stylet and Oral airway Placement Confirmation: ETT inserted through vocal cords under direct vision, positive ETCO2 and breath sounds checked- equal and bilateral Tube secured with: Tape Dental Injury: Teeth and Oropharynx as per pre-operative assessment

## 2022-08-07 NOTE — Anesthesia Postprocedure Evaluation (Signed)
Anesthesia Post Note  Patient: William Steele  Procedure(s) Performed: Cervical three-four Cervical four-five Cervical five-six Cervical six-seven Anterior Cervical Decompression Fusion (Spine Cervical)     Patient location during evaluation: PACU Anesthesia Type: General Level of consciousness: awake and alert Pain management: pain level controlled Vital Signs Assessment: post-procedure vital signs reviewed and stable Respiratory status: spontaneous breathing, nonlabored ventilation, respiratory function stable and patient connected to nasal cannula oxygen Cardiovascular status: blood pressure returned to baseline and stable Postop Assessment: no apparent nausea or vomiting Anesthetic complications: no   No notable events documented.  Last Vitals:  Vitals:   08/07/22 1330 08/07/22 1352  BP: 131/76 (!) 149/90  Pulse: 69 70  Resp: 17 18  Temp:  36.9 C  SpO2: 99% 94%    Last Pain:  Vitals:   08/07/22 1510  TempSrc:   PainSc: [place] [name]

## 2022-08-07 NOTE — Progress Notes (Signed)
Patient ID: William Steele, male   DOB: 05/03/1966, 56 y.o.   MRN: [id number] Patient is awake and alert.  Hands feel marginally better since surgery.  Incision is clean and dry.  Minimal swelling.  He is not having any difficulty with swallowing.  He did request nicotine patch postop.  We will plan discharge tomorrow morning.
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
## 2022-08-07 NOTE — Progress Notes (Signed)
Orthopedic Tech Progress Note Patient Details:  William Steele 05/22/1966 [id number]  Applied in PACU  Ortho Devices Type of Ortho Device: Soft collar Ortho Device/Splint Location: NECK Ortho Device/Splint Interventions: Ordered, Application, Adjustment   Post Interventions Patient Tolerated: Well Instructions Provided: Adjustment of device, Care of device  Arville Go 08/07/2022, 12:43 PM

## 2022-08-08 ENCOUNTER — Encounter (HOSPITAL_COMMUNITY): Payer: Self-pay | Admitting: Neurological Surgery

## 2022-08-08 MED ORDER — METHOCARBAMOL 500 MG PO TABS: 500.0000 mg | ORAL_TABLET | Freq: Four times a day (QID) | ORAL | 2 refills | Status: AC | PRN

## 2022-08-08 MED ORDER — OXYCODONE-ACETAMINOPHEN 5-325 MG PO TABS: 1.0000 | ORAL_TABLET | ORAL | 0 refills | Status: AC | PRN

## 2022-08-08 MED ORDER — DOXYCYCLINE HYCLATE 100 MG PO TABS: 100.0000 mg | ORAL_TABLET | Freq: Two times a day (BID) | ORAL | 1 refills | Status: AC

## 2022-08-08 MED FILL — Thrombin For Soln 5000 Unit: CUTANEOUS | Qty: 5000 | Status: AC
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
## 2022-08-08 NOTE — Plan of Care (Signed)
Pt doing well. Pt and wife given D/C instructions with verbal understanding. Rx's were sent to the pharmacy by MD. Pt's incision is clean and dry with no sign of infection. Pt's IV was removed prior to D/C. Pt D/C'd home via walking per MD order. Pt is stable @ D/C and has no other needs at this time. Jacelyn Cuen, RN  

## 2023-03-02 ENCOUNTER — Emergency Department (HOSPITAL_COMMUNITY)
Admission: EM | Admit: 2023-03-02 | Discharge: 2023-03-02 | Disposition: A | Discharge status: Home / Self Care | Payer: Medicaid Other | Attending: Emergency Medicine | Admitting: Emergency Medicine

## 2023-03-02 ENCOUNTER — Encounter (HOSPITAL_COMMUNITY): Payer: Self-pay

## 2023-03-02 ENCOUNTER — Other Ambulatory Visit: Payer: Self-pay

## 2023-03-02 DIAGNOSIS — M7989 Other specified soft tissue disorders: Secondary | ICD-10-CM | POA: Diagnosis present

## 2023-03-02 DIAGNOSIS — J449 Chronic obstructive pulmonary disease, unspecified: Secondary | ICD-10-CM | POA: Diagnosis not present

## 2023-03-02 DIAGNOSIS — L03114 Cellulitis of left upper limb: Secondary | ICD-10-CM | POA: Insufficient documentation

## 2023-03-02 MED ORDER — DOXYCYCLINE HYCLATE 100 MG PO TABS
100.0000 mg | ORAL_TABLET | Freq: Once | ORAL | Status: AC
  Administered 2023-03-02: 100 mg via ORAL
  Filled 2023-03-02: qty 1

## 2023-03-02 MED ORDER — DOXYCYCLINE HYCLATE 100 MG PO CAPS: 100.0000 mg | ORAL_CAPSULE | Freq: Two times a day (BID) | ORAL | 0 refills | Status: AC

## 2023-03-02 MED ORDER — IBUPROFEN 400 MG PO TABS
600.0000 mg | ORAL_TABLET | Freq: Once | ORAL | Status: AC
  Administered 2023-03-02: 600 mg via ORAL
  Filled 2023-03-02: qty 2

## 2023-03-02 MED ORDER — IBUPROFEN 600 MG PO TABS: 600.0000 mg | ORAL_TABLET | Freq: Four times a day (QID) | ORAL | 0 refills | Status: AC | PRN

## 2023-03-02 NOTE — ED Triage Notes (Signed)
Pt states he has had an insect bite to L arm x 3 days. Arm is red, swollen and painful.   Pt has used antibiotic creams and tylenol with no relief.

## 2023-03-02 NOTE — Discharge Instructions (Addendum)
You are seen today for infection to the left arm.  You are being given antibiotics and anti-inflammatories to help with the pain.  Keep the area clean and dry.  Come back if you have increased redness, increased pain or develop a fever.  Otherwise come back in about 3 days to have a recheck to make sure it is getting better.  Sometimes this can cause more severe deep infections if not treated appropriately.  Make sure you finish the entire course of antibiotics.

## 2023-03-02 NOTE — ED Provider Notes (Signed)
[facility] Provider Note   CSN: [id number] Arrival date & time: 03/02/23  1731     History {Add pertinent medical, surgical, social history, OB history to HPI:1} Chief Complaint  Patient presents with   Insect Bite    William Steele is a 57 y.o. male.  PMH of COPD, complaining of redness and swelling to left arm in the [facility] fossa.  He states this been going on for 3 days gradually getting worse, denies any drainage, denies itching but states it is painful, denies fevers or chills.  He reports pain to the site but denies any other complaints.  Denies IV drug use, denies immune suppression  HPI     Home Medications Prior to Admission medications   Medication Sig Start Date End Date Taking? Authorizing Provider  citalopram (CELEXA) 20 MG tablet Take 20 mg by mouth daily. 07/25/22   [provider]  dexamethasone (DECADRON) 4 MG tablet Take 2 mg by mouth 2 (two) times daily with a meal.    [provider]  doxycycline (VIBRA-TABS) 100 MG tablet Take 1 tablet (100 mg total) by mouth 2 (two) times daily. 08/08/22   Barnett Abu, MD  gabapentin (NEURONTIN) 300 MG capsule Take 1 capsule (300 mg total) by mouth 2 (two) times daily for 14 days. 05/24/22 08/07/22  Pricilla Loveless, MD  ibuprofen (ADVIL) 600 MG tablet Take 1 tablet (600 mg total) by mouth every 6 (six) hours as needed. 06/29/22   Honor Loh M, PA-C  methocarbamol (ROBAXIN) 500 MG tablet Take 1 tablet (500 mg total) by mouth every 8 (eight) hours as needed for muscle spasms. Patient not taking: Reported on 07/25/2022 05/24/22   Pricilla Loveless, MD  methocarbamol (ROBAXIN) 500 MG tablet Take 1 tablet (500 mg total) by mouth every 6 (six) hours as needed for muscle spasms. 08/08/22   Barnett Abu, MD  naproxen (NAPROSYN) 375 MG tablet Take 1 tablet (375 mg total) by mouth 2 (two) times daily. Patient not taking: Reported on 07/25/2022 05/24/22   Pricilla Loveless, MD   oxyCODONE-acetaminophen (PERCOCET/ROXICET) 5-325 MG tablet Take 1-2 tablets by mouth every 4 (four) hours as needed for moderate pain or severe pain. 08/08/22   Barnett Abu, MD  traZODone (DESYREL) 100 MG tablet Take 100 mg by mouth at bedtime. 07/25/22   [provider]      Allergies    Patient has no known allergies.    Review of Systems   Review of Systems  Physical Exam Updated Vital Signs BP 114/82 (BP Location: Right Arm)   Pulse 88   Temp 98.6 F (37 C) (Oral)   Resp 16   Ht 6' (1.829 m)   Wt 55.2 kg   SpO2 96%   BMI 16.51 kg/m  Physical Exam Vitals and nursing note reviewed.  Constitutional:      General: He is not in acute distress.    Appearance: He is well-developed.  HENT:     Head: Normocephalic and atraumatic.     Mouth/Throat:     Mouth: Mucous membranes are moist.  Eyes:     Conjunctiva/sclera: Conjunctivae normal.  Cardiovascular:     Rate and Rhythm: Normal rate and regular rhythm.     Heart sounds: No murmur heard. Pulmonary:     Effort: Pulmonary effort is normal. No respiratory distress.     Breath sounds: Normal breath sounds.  Abdominal:     Palpations: Abdomen is soft.  Tenderness: There is no abdominal tenderness.  Musculoskeletal:        General: No swelling.     Cervical back: Neck supple.  Skin:    General: Skin is warm and dry.     Capillary Refill: Capillary refill takes less than 2 seconds.     Comments: Approximately 5 cm diameter area of erythema with induration to left antecubital fossa.  No fluctuance  Neurological:     General: No focal deficit present.     Mental Status: He is alert and oriented to person, place, and time.  Psychiatric:        Mood and Affect: Mood normal.     ED Results / Procedures / Treatments   Labs (all labs ordered are listed, but only abnormal results are displayed) Labs Reviewed - No data to display  EKG None  Radiology No results found.  Procedures Procedures  {Document  cardiac monitor, telemetry assessment procedure when appropriate:1}  Medications Ordered in ED Medications  ibuprofen (ADVIL) tablet 600 mg (600 mg Oral Given 03/02/23 1849)  doxycycline (VIBRA-TABS) tablet 100 mg (100 mg Oral Given 03/02/23 1849)    ED Course/ Medical Decision Making/ A&P   {   Click here for ABCD2, HEART and other calculatorsREFRESH Note before signing :1}                          Medical Decision Making Risk Prescription drug management.   ***  {Document critical care time when appropriate:1} {Document review of labs and clinical decision tools ie heart score, Chads2Vasc2 etc:1}  {Document your independent review of radiology images, and any outside records:1} {Document your discussion with family members, caretakers, and with consultants:1} {Document social determinants of health affecting pt's care:1} {Document your decision making why or why not admission, treatments were needed:1} Final Clinical Impression(s) / ED Diagnoses Final diagnoses:  None    Rx / DC Orders ED Discharge Orders     None

## 2024-03-11 ENCOUNTER — Other Ambulatory Visit: Payer: Self-pay

## 2024-03-11 ENCOUNTER — Encounter (HOSPITAL_COMMUNITY): Payer: Self-pay

## 2024-03-11 ENCOUNTER — Emergency Department (HOSPITAL_COMMUNITY)

## 2024-03-11 ENCOUNTER — Emergency Department (HOSPITAL_COMMUNITY)
Admission: EM | Admit: 2024-03-11 | Discharge: 2024-03-11 | Discharge status: Against Medical Advice | Attending: Emergency Medicine | Admitting: Emergency Medicine

## 2024-03-11 DIAGNOSIS — R079 Chest pain, unspecified: Secondary | ICD-10-CM | POA: Insufficient documentation

## 2024-03-11 DIAGNOSIS — R200 Anesthesia of skin: Secondary | ICD-10-CM | POA: Diagnosis not present

## 2024-03-11 DIAGNOSIS — Z5321 Procedure and treatment not carried out due to patient leaving prior to being seen by health care provider: Secondary | ICD-10-CM | POA: Insufficient documentation

## 2024-03-11 DIAGNOSIS — R42 Dizziness and giddiness: Secondary | ICD-10-CM | POA: Diagnosis not present

## 2024-03-11 DIAGNOSIS — R531 Weakness: Secondary | ICD-10-CM | POA: Diagnosis not present

## 2024-03-11 NOTE — ED Notes (Addendum)
 Pt left from VT1 and exited the ED. Pt stated that he was leaving. Attempts to stop pt made.

## 2024-03-11 NOTE — ED Triage Notes (Addendum)
 Pt from home complains of dull central chest pain happening since 4pm yesterday. Pt states that it feels like his heart is numb. Denies SOB but endorses bilateral extremity numbness, weakness and lightheadedness. Pt AAOx4, and ambulatory.

## 2024-04-30 ENCOUNTER — Other Ambulatory Visit: Payer: Self-pay

## 2024-04-30 ENCOUNTER — Emergency Department (HOSPITAL_COMMUNITY)
Admission: EM | Admit: 2024-04-30 | Discharge: 2024-04-30 | Disposition: A | Discharge status: Against Medical Advice | Attending: Emergency Medicine | Admitting: Emergency Medicine

## 2024-04-30 ENCOUNTER — Encounter (HOSPITAL_COMMUNITY): Payer: Self-pay

## 2024-04-30 DIAGNOSIS — J189 Pneumonia, unspecified organism: Secondary | ICD-10-CM | POA: Insufficient documentation

## 2024-04-30 DIAGNOSIS — M546 Pain in thoracic spine: Secondary | ICD-10-CM | POA: Insufficient documentation

## 2024-04-30 DIAGNOSIS — Z5321 Procedure and treatment not carried out due to patient leaving prior to being seen by health care provider: Secondary | ICD-10-CM | POA: Diagnosis not present

## 2024-04-30 DIAGNOSIS — R059 Cough, unspecified: Secondary | ICD-10-CM | POA: Diagnosis not present

## 2024-04-30 NOTE — ED Triage Notes (Signed)
 Pt report left upper back pain  x 3-4 days that hurts when he breathes.  Pt reports he is afraid he has pneumonia.  Pt reports he has a productive cough that he has had for 25 years.

## 2024-04-30 NOTE — ED Notes (Signed)
 Pt abruptly started walking out of ED with his significant other. When asked why he wanted to leave he replied  Im not a patient man, I'd rather be at home. ED staff tried to get pt to stay but he was adamant about leaving. Pt agreed to signed MSE waiver.
